# Patient Record
Sex: Female | Born: 1990 | Race: White | Hispanic: No | Marital: Married
Health system: Midwestern US, Academic
[De-identification: ages and names within clinical notes are randomized; demographics above are authoritative.]

## PROBLEM LIST (undated history)

## (undated) DIAGNOSIS — G43909 Migraine, unspecified, not intractable, without status migrainosus: Secondary | ICD-10-CM

## (undated) DIAGNOSIS — F419 Anxiety disorder, unspecified: Secondary | ICD-10-CM

## (undated) HISTORY — PX: HIP SURGERY: SHX245

## (undated) HISTORY — DX: Anxiety disorder, unspecified: F41.9

## (undated) HISTORY — DX: Migraine, unspecified, not intractable, without status migrainosus: G43.909

## (undated) LAB — ABO/RH: Rh Factor: POSITIVE

## (undated) LAB — ANTIBODY SCREEN: Antibody Screen: NEGATIVE

## (undated) LAB — HIV 1+2 ANTIBODY/ANTIGEN WITH REFLEX: HIV 1+2 AB/AGN: NEGATIVE

## (undated) LAB — HEMOGLOBINOPATHY EVALUATION: Hgb C: NONREACTIVE

## (undated) LAB — TREPONEMA PALLIDUM AB WITH REFLEX: Treponema Pallidum: NONREACTIVE

## (undated) LAB — RUBELLA IMMUNE STATUS: Rubella IgG Scr: IMMUNE

## (undated) LAB — STREP B DNA AMPLIFIED ASSAY: Strep B DNA Amp: NEGATIVE

## (undated) LAB — HEPATITIS B SURFACE ANTIGEN: Hep B Surface Ag: NEGATIVE

---

## 2014-12-13 ENCOUNTER — Ambulatory Visit (INDEPENDENT_AMBULATORY_CARE_PROVIDER_SITE_OTHER): Payer: 59 | Admitting: Medical

## 2014-12-13 ENCOUNTER — Encounter: Payer: Self-pay | Admitting: Medical

## 2014-12-13 VITALS — BP 126/82 | HR 80 | Ht 67.0 in | Wt 200.2 lb

## 2014-12-13 DIAGNOSIS — G4700 Insomnia, unspecified: Secondary | ICD-10-CM | POA: Diagnosis not present

## 2014-12-13 DIAGNOSIS — Z0000 Encounter for general adult medical examination without abnormal findings: Secondary | ICD-10-CM | POA: Diagnosis not present

## 2014-12-13 DIAGNOSIS — Z3041 Encounter for surveillance of contraceptive pills: Secondary | ICD-10-CM

## 2014-12-13 LAB — POCT URINE PREGNANCY: Preg Test, Ur: NEGATIVE

## 2014-12-13 LAB — POCT URINALYSIS DIPSTICK
Bilirubin, UA: NEGATIVE
Blood, UA: NEGATIVE
Glucose, UA: NEGATIVE
KETONES UA: NEGATIVE
Leukocytes, UA: NEGATIVE
Nitrite, UA: NEGATIVE
PROTEIN UA: NEGATIVE
Urobilinogen, UA: NEGATIVE
pH, UA: 6

## 2014-12-13 MED ORDER — NORGESTIM-ETH ESTRAD TRIPHASIC 0.18/0.215/0.25 MG-35 MCG PO TABS: 1.0000 | ORAL_TABLET | Freq: Every day | ORAL | Status: AC

## 2014-12-13 MED ORDER — PHENTERMINE HCL 37.5 MG PO TABS: 37.5000 mg | ORAL_TABLET | Freq: Every day | ORAL | Status: AC

## 2014-12-13 NOTE — Progress Notes (Signed)
Subjective:   HPI  Laura Hull is a 24 y.o. female who presents for a complete physical.  New patient today.  Lived in [place], moved to [place] with fiance for 2 years, and now here in [place] for husband's job with [employer].   Preventative care: Last labs: none prior  Prior vaccinations: TD or Tdap: unsure Influenza: yearly Had HPV series, had whole series of vaccines freshman year of college  Concerns: Does have hx/o anxiety, gets panic attacks at times.   Never been on medication for this.  Sometimes lies in bed worrying about stuff.    Exercises regularly, eats healthy, was college athlete, but having problems losing weight.  Has had problems with weight for about 1.5 years.  Has recently added Yoga.  Gyn hx/o - no prior pregnancy.   Was on depo provera prior for about 4-5 years, current on Tri Sprintec, doing ok on this.  Periods are not particular painful or heavy.  Only has one ever sexual partner, fiance, and they were both virgins.   No urinary or vaginal c/o.    Has hx/o migraines, but gets these rarely.   Has sleep issues.  Takes melatonin daily.   Has trouble getting to sleep, but once sleep is fine.  Lies there thinking about.   Reviewed their medical, surgical, family, social, medication, and allergy history and updated chart as appropriate.  Past Medical History  Diagnosis Date  . Migraine     rarely gets, but family hx/o atypical migraines  . Anxiety     diagnosed freshman in college; no prior medication, no current counseling as of 11/2014    Past Surgical History  Procedure Laterality Date  . Hip surgery  2013, 2014    anterior surgery for each, bilat due to hip flexor issues, labral repairs, bone shaving    History   Social History  . Marital Status: Single    Spouse Name: N/A  . Number of Children: N/A  . Years of Education: N/A   Occupational History  . Not on file.   Social History Main Topics  . Smoking status: Never Smoker   . Smokeless  tobacco: Not on file  . Alcohol Use: 1.2 oz/week    2 Glasses of wine, 0 Standard drinks or equivalent per week     Comment: wine   . Drug Use: No  . Sexual Activity: Not on file   Other Topics Concern  . Not on file   Social History Narrative   Fiance, getting married 01/13/15, has exercise science degree, consider PT, husband works for [employer].   Exercise - was college soccer player, but now walking, running, biking, weight.  Diet -lots of home cooking.       Family History  Problem Relation Age of Onset  . Hypertension Father   . Diabetes Father   . Cancer Neg Hx   . Heart disease Neg Hx   . Stroke Neg Hx      Current outpatient prescriptions:  .  Ascorbic Acid (VITAMIN C PO), Take by mouth., Disp: , Rfl:  .  Multiple Vitamins-Minerals (MULTIVITAMIN GUMMIES ADULT PO), Take by mouth., Disp: , Rfl:  .  Norgestimate-Ethinyl Estradiol Triphasic (TRI-SPRINTEC) 0.18/0.215/0.25 MG-35 MCG tablet, Take 1 tablet by mouth daily., Disp: 1 Package, Rfl: 11 .  Omega-3 Fatty Acids (FISH OIL PO), Take by mouth., Disp: , Rfl:  .  phentermine (ADIPEX-P) 37.5 MG tablet, Take 1 tablet (37.5 mg total) by mouth daily before breakfast., Disp: 30 tablet,  Rfl: 1  No Known Allergies   Review of Systems Constitutional: -fever, -chills, -sweats, -unexpected weight change, -decreased appetite, -fatigue Allergy: -sneezing, -itching, -congestion Dermatology: -changing moles, --rash, -lumps ENT: -runny nose, -ear pain, -sore throat, -hoarseness, -sinus pain, -teeth pain, - ringing in ears, -hearing loss, -nosebleeds Cardiology: -chest pain, -palpitations, -swelling, -difficulty breathing when lying flat, -waking up short of breath Respiratory: -cough, -shortness of breath, -difficulty breathing with exercise or exertion, -wheezing, -coughing up blood Gastroenterology: -abdominal pain, -nausea, -vomiting, -diarrhea, -constipation, -blood in stool, -changes in bowel movement, -difficulty swallowing or  eating Hematology: -bleeding, -bruising  Musculoskeletal: -joint aches, -muscle aches, -joint swelling, -back pain, -neck pain, -cramping, -changes in gait Ophthalmology: denies vision changes, eye redness, itching, discharge Urology: -burning with urination, -difficulty urinating, -blood in urine, -urinary frequency, -urgency, -incontinence Neurology: -headache, -weakness, -tingling, -numbness, -memory loss, -falls, -dizziness Psychology: -depressed mood, -agitation, -sleep problems     Objective:   Physical Exam  BP 126/82 mmHg  Pulse 80  Ht  (1.702 m)  Wt 200 lb 3.2 oz (90.81 kg)  BMI 31.35 kg/m2  General appearance: alert, no distress, WD/WN, obese white female Skin: no worrisome lesions HEENT: normocephalic, conjunctiva/corneas normal, sclerae anicteric, PERRLA, EOMi, nares patent, scar tissue right TM, slight scar tissue left lower TM, no discharge or erythema, pharynx normal Oral cavity: MMM, tongue normal, teeth normal Neck: supple, no lymphadenopathy, no thyromegaly, no masses, normal ROM, no bruits Chest: non tender, normal shape and expansion Heart: RRR, normal S1, S2, no murmurs Lungs: CTA bilaterally, no wheezes, rhonchi, or rales Abdomen: +bs, soft, non tender, non distended, no masses, no hepatomegaly, no splenomegaly, no bruits Back: non tender, normal ROM, no scoliosis Musculoskeletal: +bilat anterior hip arthroscopic port surgical scars, upper extremities non tender, no obvious deformity, normal ROM throughout, lower extremities non tender, no obvious deformity, normal ROM throughout Extremities: no edema, no cyanosis, no clubbing Pulses: 2+ symmetric, upper and lower extremities, normal cap refill Neurological: alert, oriented x 3, CN2-12 intact, strength normal upper extremities and lower extremities, sensation normal throughout, DTRs 2+ throughout, no cerebellar signs, gait normal Psychiatric: normal affect, behavior normal, pleasant  Breast: not  examined Gyn: Normal external genitalia without lesions, vagina with normal mucosa, cervix without lesions, no cervical motion tenderness, no abnormal vaginal discharge.  Uterus and adnexa not enlarged, nontender, no masses.  Exam chaperoned by nurse. Rectal: deferred  Assessment and Plan :    Encounter Diagnoses  Name Primary?  . Encounter for health maintenance examination in adult Yes  . Encounter for surveillance of contraceptive pills   . Insomnia    Physical exam - discussed healthy lifestyle, diet, exercise, preventative care, vaccinations, and addressed their concerns.  Handout given.  Contraception - We discussed her interest in oral hormonal contraception.  Discussed treatment options, types of OCPs, various other methods available.  Discussed non hormonal contraception as well.  We discussed risks of OCPs including headache, nausea, breast tenderness, irregular bleeding or spotting, possible risks of blood clots, heart attack, stroke.     Discussed advantages of OCPs including possible decreased premenstrual symptoms, improving cramps, regulating cycles, decreasing heavy flow.  Discussed proper use of medication.  She understands the benefits, risks, and wishes to c/t TriSprintec. Urine pregnancy negative. Congratulated her on her upcoming wedding.     Insomnia - discussed her concerns, ways to deal with thoughts, anxiety, sleep strategies.   Can c/t melatonin for now  Obesity - discussed diet, exercise, strategies to lose weight, sleep.   Begin  trial of phentermine.   Discussed risks/benefits of medication.  F/u 4-6 wk, sooner prn.  Follow-up 4-6 wk on medication

## 2015-01-29 ENCOUNTER — Ambulatory Visit: Payer: 59 | Admitting: Medical

## 2015-02-19 ENCOUNTER — Emergency Department (HOSPITAL_COMMUNITY): Payer: Worker's Compensation

## 2015-02-19 ENCOUNTER — Emergency Department (HOSPITAL_COMMUNITY)
Admission: EM | Admit: 2015-02-19 | Discharge: 2015-02-19 | Disposition: A | Discharge status: Home / Self Care | Payer: Worker's Compensation | Attending: Emergency Medicine | Admitting: Emergency Medicine

## 2015-02-19 ENCOUNTER — Encounter (HOSPITAL_COMMUNITY): Payer: Self-pay | Admitting: Emergency Medicine

## 2015-02-19 DIAGNOSIS — Y998 Other external cause status: Secondary | ICD-10-CM | POA: Insufficient documentation

## 2015-02-19 DIAGNOSIS — W540XXA Bitten by dog, initial encounter: Secondary | ICD-10-CM | POA: Diagnosis not present

## 2015-02-19 DIAGNOSIS — Z23 Encounter for immunization: Secondary | ICD-10-CM | POA: Insufficient documentation

## 2015-02-19 DIAGNOSIS — S61412A Laceration without foreign body of left hand, initial encounter: Secondary | ICD-10-CM | POA: Insufficient documentation

## 2015-02-19 DIAGNOSIS — Y9389 Activity, other specified: Secondary | ICD-10-CM | POA: Diagnosis not present

## 2015-02-19 DIAGNOSIS — Z8679 Personal history of other diseases of the circulatory system: Secondary | ICD-10-CM | POA: Insufficient documentation

## 2015-02-19 DIAGNOSIS — Z8659 Personal history of other mental and behavioral disorders: Secondary | ICD-10-CM | POA: Diagnosis not present

## 2015-02-19 DIAGNOSIS — Y9289 Other specified places as the place of occurrence of the external cause: Secondary | ICD-10-CM | POA: Diagnosis not present

## 2015-02-19 MED ORDER — LIDOCAINE-EPINEPHRINE (PF) 2 %-1:200000 IJ SOLN
20.0000 mL | Freq: Once | INTRAMUSCULAR | Status: DC
  Filled 2015-02-19: qty 20

## 2015-02-19 MED ORDER — HYDROCODONE-ACETAMINOPHEN 5-325 MG PO TABS: 1.0000 | ORAL_TABLET | Freq: Four times a day (QID) | ORAL | Status: AC | PRN

## 2015-02-19 MED ORDER — BACITRACIN ZINC 500 UNIT/GM EX OINT
1.0000 "application " | TOPICAL_OINTMENT | Freq: Two times a day (BID) | CUTANEOUS | Status: DC
  Administered 2015-02-19: 1 via TOPICAL

## 2015-02-19 MED ORDER — OXYCODONE-ACETAMINOPHEN 5-325 MG PO TABS
1.0000 | ORAL_TABLET | Freq: Once | ORAL | Status: AC
  Administered 2015-02-19: 1 via ORAL
  Filled 2015-02-19: qty 1

## 2015-02-19 MED ORDER — AMOXICILLIN-POT CLAVULANATE 875-125 MG PO TABS: 1.0000 | ORAL_TABLET | Freq: Two times a day (BID) | ORAL | Status: AC

## 2015-02-19 MED ORDER — SODIUM BICARBONATE 4 % IV SOLN
5.0000 mL | Freq: Once | INTRAVENOUS | Status: DC
  Filled 2015-02-19: qty 5

## 2015-02-19 MED ORDER — TETANUS-DIPHTH-ACELL PERTUSSIS 5-2.5-18.5 LF-MCG/0.5 IM SUSP
0.5000 mL | Freq: Once | INTRAMUSCULAR | Status: AC
  Administered 2015-02-19: 0.5 mL via INTRAMUSCULAR
  Filled 2015-02-19: qty 0.5

## 2015-02-19 NOTE — Progress Notes (Signed)
Orthopedic Tech Progress Note Patient Details:  Laura Hull March 23, 1991 [id number]  Ortho Devices Type of Ortho Device: Ace wrap, Volar splint Ortho Device/Splint Interventions: Application   Cammer, Mickie Bail 02/19/2015, 6:22 PM

## 2015-02-19 NOTE — ED Notes (Signed)
MD Rees at the bedside  

## 2015-02-19 NOTE — ED Notes (Signed)
Patient reports, "I feel like I am going to pass out." Patient's feet placed up and head laid back. Patient given cool rag to place on her forehead. Waiting for pain relief. Patient encouraged to take deep breaths and relax.

## 2015-02-19 NOTE — ED Notes (Signed)
Pt from [facility] for eval of dog bite to left hand. States dog was up to date on all shots. Sensation intact and t able to move fingers, pulses present at this time. Puncture wound noted to anterior part of hand and puncture wound to palm. nad noted. Pt tearful.

## 2015-02-19 NOTE — ED Notes (Signed)
Called Phlebotomy to complete.

## 2015-02-19 NOTE — ED Notes (Signed)
PA made aware of the patient's current condition.

## 2015-02-19 NOTE — ED Provider Notes (Signed)
CSN: [id number]     Arrival date & time 02/19/15  1118 History  This chart was scribed for non-physician practitioner, Roxy Horseman, PA-C working with Tilden Fossa, MD by Doreatha Martin, ED scribe. This patient was seen in room TR05C/TR05C and the patient's care was started at 11:43 AM    Chief Complaint  Patient presents with  . Animal Bite   The history is provided by the patient. No language interpreter was used.    HPI Comments: Laura Hull is a 24 y.o. female who presents to the Emergency Department with a chief complaint of an dog bite with controlled bleeding on the left hand to the palmar and anterior aspect proximal to the thumb that occurred this morning. She states associated moderate pain, lightheadedness secondary to pain. Pt notes that pain is worsened with movement, resistance. She notes that she is able to move her digits. 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 Her tetanus is out of date. She denies any other injuries.   Past Medical History  Diagnosis Date  . Migraine     rarely gets, but family hx/o atypical migraines  . Anxiety     diagnosed freshman in college; no prior medication, no current counseling as of 11/2014   Past Surgical History  Procedure Laterality Date  . Hip surgery  2013, 2014    anterior surgery for each, bilat due to hip flexor issues, labral repairs, bone shaving   Family History  Problem Relation Age of Onset  . Hypertension Father   . Diabetes Father   . Cancer Neg Hx   . Heart disease Neg Hx   . Stroke Neg Hx    Social History  Substance Use Topics  . Smoking status: Never Smoker   . Smokeless tobacco: None  . Alcohol Use: 1.2 oz/week    2 Glasses of wine, 0 Standard drinks or equivalent per week     Comment: wine    OB History    No data available     Review of Systems  Musculoskeletal: Positive for arthralgias.  Skin: Positive for wound.  Neurological: Positive for light-headedness.    Allergies  Review of patient's allergies indicates no known allergies.  Home Medications   Prior to Admission medications   Medication Sig Start Date End Date Taking? Authorizing Provider  Ascorbic Acid (VITAMIN C PO) Take by mouth.    Historical Provider, MD  Multiple Vitamins-Minerals (MULTIVITAMIN GUMMIES ADULT PO) Take by mouth.    Historical Provider, MD  Norgestimate-Ethinyl Estradiol Triphasic (TRI-SPRINTEC) 0.18/0.215/0.25 MG-35 MCG tablet Take 1 tablet by mouth daily. 12/13/14   Kermit Balo Tysinger, PA-C  Omega-3 Fatty Acids (FISH OIL PO) Take by mouth.    Historical Provider, MD  phentermine (ADIPEX-P) 37.5 MG tablet Take 1 tablet (37.5 mg total) by mouth daily before breakfast. 12/13/14   Kermit Balo Tysinger, PA-C   BP 128/99 mmHg  Pulse 99  Temp(Src) 98.4 F (36.9 C) (Oral)  Resp 18  Ht 5\' 7"  (1.702 m)  Wt 190 lb (86.183 kg)  BMI 29.75 kg/m2  SpO2 100%  LMP 02/19/2015 (Exact Date) Physical Exam  Constitutional: She is oriented to person, place, and time. She appears well-developed and well-nourished.  HENT:  Head: Normocephalic and atraumatic.  Eyes: Conjunctivae and EOM are normal. Pupils are equal, round, and reactive to light.  Neck: Normal range of motion. Neck supple.  Cardiovascular: Normal rate and intact distal pulses.   Intact distal pulses  with brisk capillary refill  Pulmonary/Chest: Effort normal. No respiratory distress.  Abdominal: She exhibits no distension.  Musculoskeletal: Normal range of motion.  Range of motion and strength of left digits is 5/5 isolated at all joints  Neurological: She is alert and oriented to person, place, and time.  Distal sensation intact  Skin: Skin is warm and dry.  Left hand 1 cm laceration on posterior aspect, 1 cm laceration on Palmer aspect, no retained foreign body  Psychiatric: She has a normal mood and affect. Her behavior is normal.  Nursing note and vitals reviewed.  ED Course  Procedures (including critical care  time) DIAGNOSTIC STUDIES: Oxygen Saturation is 100% on RA, normal by my interpretation.    COORDINATION OF CARE: 11:48 AM Discussed treatment plan with pt at bedside and pt agreed to plan.   Imaging Review Dg Hand Complete Left  02/19/2015   CLINICAL DATA:  Laceration, puncture wound to the anterior and posterior left hand.  EXAM: LEFT HAND - COMPLETE 3+ VIEW  COMPARISON:  None.  FINDINGS: There is no evidence of fracture or dislocation. There is no evidence of arthropathy or other focal bone abnormality. Soft tissues are unremarkable.  IMPRESSION: No acute osseous injury of the left hand.   Electronically Signed   By: Elige Ko   On: 02/19/2015 13:07   I have personally reviewed and evaluated these images and lab results as part of my medical decision-making.  SPLINT APPLICATION Date/Time: 2:54 PM Authorized by: Roxy Horseman Consent: Verbal consent obtained. Risks and benefits: risks, benefits and alternatives were discussed Consent given by: patient Splint applied XB:[id number] technician Location details: left hand/wrist Splint type: volar Supplies used: fiberglass/ace Post-procedure: The splinted body part was neurovascularly unchanged following the procedure. Patient tolerance: Patient tolerated the procedure well with no immediate complications.    MDM   Final diagnoses:  Dog bite    Patient with 2 small lacerations on her left hand from a dog bite. One is posterior, one is [employer]. There is no evidence of tendon injury. Plain films are negative. Discussed patient with Dr. Madilyn Hook, who recommends consultation with hand surgery for close follow-up. Appreciate phone consultation with Dr. Orlan Leavens, who recommends not closing the lacerations, placing the patient in a splint to immobilize the fingers and starting the patient on anti-biotics. I will give the patient Augmentin. She will need to follow-up with Dr. Melvyn Novas in his office. Return precautions have been given. The wound  was copiously irrigated and rinsed in the emergency department.   I personally performed the services described in this documentation, which was scribed in my presence. The recorded information has been reviewed and is accurate.      Roxy Horseman, PA-C 02/19/15 1455  Tilden Fossa, MD 02/19/15 [phone]

## 2015-02-19 NOTE — Discharge Instructions (Signed)

## 2015-02-19 NOTE — ED Notes (Signed)
Patient waiting for Phlebotomy for Worker's Comp case.

## 2015-02-19 NOTE — ED Notes (Signed)
Patient returned from X-ray 

## 2016-04-25 ENCOUNTER — Inpatient Hospital Stay
Admit: 2016-04-25 | Discharge: 2016-04-25 | Disposition: A | Discharge status: Home / Self Care | Payer: PRIVATE HEALTH INSURANCE

## 2016-04-25 DIAGNOSIS — O99611 Diseases of the digestive system complicating pregnancy, first trimester: Secondary | ICD-10-CM

## 2016-04-25 LAB — DIFFERENTIAL
Basophils Absolute: 9 /uL (ref 0–200)
Basophils Relative: 0.1 % (ref 0.0–1.0)
Eosinophils Absolute: 71 /uL (ref 15–500)
Eosinophils Relative: 0.8 % (ref 0.0–8.0)
Lymphocytes Absolute: 2287 /uL (ref 850–3900)
Lymphocytes Relative: 25.7 % (ref 15.0–45.0)
Monocytes Absolute: 703 /uL (ref 200–950)
Monocytes Relative: 7.9 % (ref 0.0–12.0)
Neutrophils Absolute: 5830 /uL (ref 1500–7800)
Neutrophils Relative: 65.5 % (ref 40.0–80.0)

## 2016-04-25 LAB — URINALYSIS W/RFL TO MICROSCOPIC
Bilirubin, UA: NEGATIVE
Glucose, UA: NEGATIVE mg/dL
Ketones, UA: NEGATIVE mg/dL
Leukocyte Esterase, UA: NEGATIVE
Nitrite, UA: NEGATIVE
Protein, UA: NEGATIVE mg/dL
RBC, UA: <1 /HPF (ref 0–3)
Specific Gravity, UA: 1.013 (ref 1.005–1.035)
Squam Epithel, UA: <1 /HPF (ref 0–5)
Urobilinogen, UA: <2 mg/dL (ref 0.2–1.9)
WBC, UA: <1 /HPF (ref 0–5)
pH, UA: 6 (ref 5.0–8.0)

## 2016-04-25 LAB — BASIC METABOLIC PANEL
Anion Gap: 7 mmol/L (ref 3–16)
BUN: 10 mg/dL (ref 7–25)
CO2: 26 mmol/L (ref 21–33)
Calcium: 9.3 mg/dL (ref 8.6–10.3)
Chloride: 103 mmol/L (ref 98–110)
Creatinine: 0.69 mg/dL (ref 0.60–1.30)
Glucose: 96 mg/dL (ref 70–100)
Osmolality, Calculated: 281 mosm/kg (ref 278–305)
Potassium: 3.7 mmol/L (ref 3.5–5.3)
Sodium: 136 mmol/L (ref 133–146)
eGFR AA CKD-EPI: >90 See note.
eGFR NONAA CKD-EPI: >90 See note.

## 2016-04-25 LAB — CBC
Hematocrit: 39.7 % (ref 35.0–45.0)
Hemoglobin: 13.6 g/dL (ref 11.7–15.5)
MCH: 29.2 pg (ref 27.0–33.0)
MCHC: 34.3 g/dL (ref 32.0–36.0)
MCV: 85 fL (ref 80.0–100.0)
MPV: 8 fL (ref 7.5–11.5)
Platelets: 221 10E3/uL (ref 140–400)
RBC: 4.67 10E6/uL (ref 3.80–5.10)
RDW: 12.4 % (ref 11.0–15.0)
WBC: 8.9 10E3/uL (ref 3.8–10.8)

## 2016-04-25 LAB — HCG, QUANTITATIVE, PREGNANCY: hCG Quant: 17057 m[IU]/mL

## 2016-04-25 LAB — HCG URINE, QUALITATIVE: Preg Test, Ur: POSITIVE — AB

## 2016-04-25 NOTE — ED Provider Notes (Signed)
[place] ED Note    Date of service:  04/25/2016    Reason for Visit: Constipation      Patient History     HPI  Patient is a 25 year old female who presents the emergency department complaining of constipation.  Patient states she believes she is approximately [redacted] weeks pregnant based on last menstrual period.  She has not seen her OB/GYN.  She states she normally has bowel movements every other day in the past if she gets constipated will occasionally take MiraLAX.  She states she has not had a bowel movement for 4 days.  2 days ago she contacted her OB/GYN who recommended that she take MiraLAX and she states this is not helped her constipation.  She states she has been pressing on her abdomen because this makes it feel better.  Tonight she had cramping pain in her abdomen that woke her from sleep and she states she was diaphoretic.  The pain has at this point improved.  She states the pain waxes and wanes.  She states she mostly feels that his lower abdomen.  She feels that the pain improves when she presses on her stomach.  Admits to nausea.  Denies vomiting, vaginal bleeding or discharge.     No past medical history on file.    No past surgical history on file.    Patient  has no tobacco, alcohol, and drug history on file.      Previous Medications    POLYETHYLENE GLYCOL (MIRALAX) 17 GRAM PACKET    Take 17 g by mouth daily.    PRENAT 115-IRON FUM-FOLIC-DSS ORAL    Take by mouth.       Allergies:   Allergies as of 04/25/2016    (No Known Allergies)       Review of Systems     Review of Systems   Constitutional: Negative for chills and fever.   Respiratory: Negative for shortness of breath.    Cardiovascular: Negative for chest pain.   Gastrointestinal: Positive for abdominal pain, constipation and nausea. Negative for diarrhea and vomiting.   Genitourinary: Negative for difficulty urinating, dysuria, pelvic pain, vaginal bleeding and vaginal discharge.    Musculoskeletal: Negative for back pain.   Skin: Negative for color change and wound.   Neurological: Negative for headaches.   All other systems reviewed and are negative.          Physical Exam     ED Triage Vitals [04/25/16 0214]   Vital Signs Group      Temp 97.8 F (36.6 C)      Temp src       Heart Rate 79      Heart Rate Source       Resp 16      SpO2 100 %      BP 137/74      BP Location Left arm      BP Method Automatic      Patient Position Sitting   SpO2 100 %   O2 Device        Physical Exam   Nursing note and vitals reviewed.  Constitutional: She appears well-developed and well-nourished. No distress.   HENT:   Head: Normocephalic and atraumatic.   Cardiovascular: Normal rate, regular rhythm and normal heart sounds.    Pulmonary/Chest: Effort normal and breath sounds normal. No respiratory distress.   Abdominal: Soft. Bowel sounds are normal. She exhibits no distension. There is no tenderness.   Reports soreness in her  abdomen from where she was pressing on it but no tenderness on exam   Musculoskeletal: Normal range of motion.   Neurological: She is oriented to person, place, time and situation.  Normal speech without aphasia or dysarthria.  Moves all extremities spontaneosly and symmetrically.    Skin: Skin is warm and dry. She is not diaphoretic.   Psychiatric: She has a normal mood and affect. Her behavior is normal. Judgment and thought content normal.         Diagnostic Studies     Labs:    Labs Reviewed   CBC   DIFFERENTIAL   BASIC METABOLIC PANEL   HCG, QUANTITATIVE, PREGNANCY   URINALYSIS W/RFL TO MICROSCOP, NO CULT   HCG URINE, QUALITATIVE         Radiology:    No tests were performed during this ED visit    EKG:    No EKG Performed    Emergency Department Procedures     Procedures    ED Course and MDM     Dilpreet Lovins is a 25 y.o. female who presented to the emergency department with Constipation      ED Course     History and physical exam are performed.  Patient is well-appearing and  in no acute distress.  She has no focal area of tenderness.  Patient reports that she believes she is approximately [redacted] weeks pregnant based on last menstrual period.  She has not followed up with an OB/GYN at this point.  She has had constipation in the past prior to pregnancy and states that she has to taking MiraLAX 2 days ago and still has been woken up the last 2 nights with cramping in her abdomen.    We did obtain some baseline labs as well as a serum Quant to ensure that this is not related to her pregnancy complication.  Lives are in process.  Plan is for Dr. Ellis Savage to follow up on labs and possibly perform a transvaginal ultrasound.  Patient denies any vaginal bleeding or discharge.  If labs are unremarkable patient can likely be discharged with instructions for constipation management including continuing MiraLAX and possibly adding milk of magnesia.  She'll follow-up with her OB/GYN.    This patient has been seen and evaluated by my attending physician, Dartha Lodge, MD,  who agrees with my treatment and assessment and plan       Critical Care Time (Attendings)          Wardell [place], [place]  04/25/16 [phone]

## 2016-04-25 NOTE — Unmapped (Addendum)
Discharge instructions    ?? Increasing dietary fiber and fluids or using bulk-forming laxatives (Miralax) is the preferred treatment of constipation during pregnancy since these agents are not absorbed    ?? Take all your medications as directed.   - May use milk of magnesia if needed     ?? Follow up as instructed with your OBGYN.     ?? Return to the Emergency Department for any new or worsening symptoms

## 2016-04-25 NOTE — ED Notes (Signed)
MD at bedside.

## 2016-04-25 NOTE — ED Triage Notes (Signed)
Patient complaining of constipation since Tuesday with abd pain.  States she is [redacted] weeks pregnant.

## 2016-04-25 NOTE — ED Provider Notes (Signed)
ED Attending Attestation Note    Date of service:  04/25/2016    This patient was seen by the advanced practice provider.  I have seen and examined the patient, agree with the workup, evaluation, management and diagnosis.  The care plan has been discussed and I concur.      My assessment reveals a 25 y.o. female with abdominal cramping, not had a BM in a week.  Exam is unremarkable.  Labs unremarkable.  Will recommend increasing miralax and close f/u with OB/Gyn.

## 2016-06-14 IMAGING — DX DG HAND COMPLETE 3+V*L*
3 series · 3 of 3 positions shown · non-contrast
Comparison: None.

CLINICAL DATA: Laceration, puncture wound to the anterior and
posterior left hand.

EXAM:
LEFT HAND - COMPLETE 3+ VIEW

[hand ap]
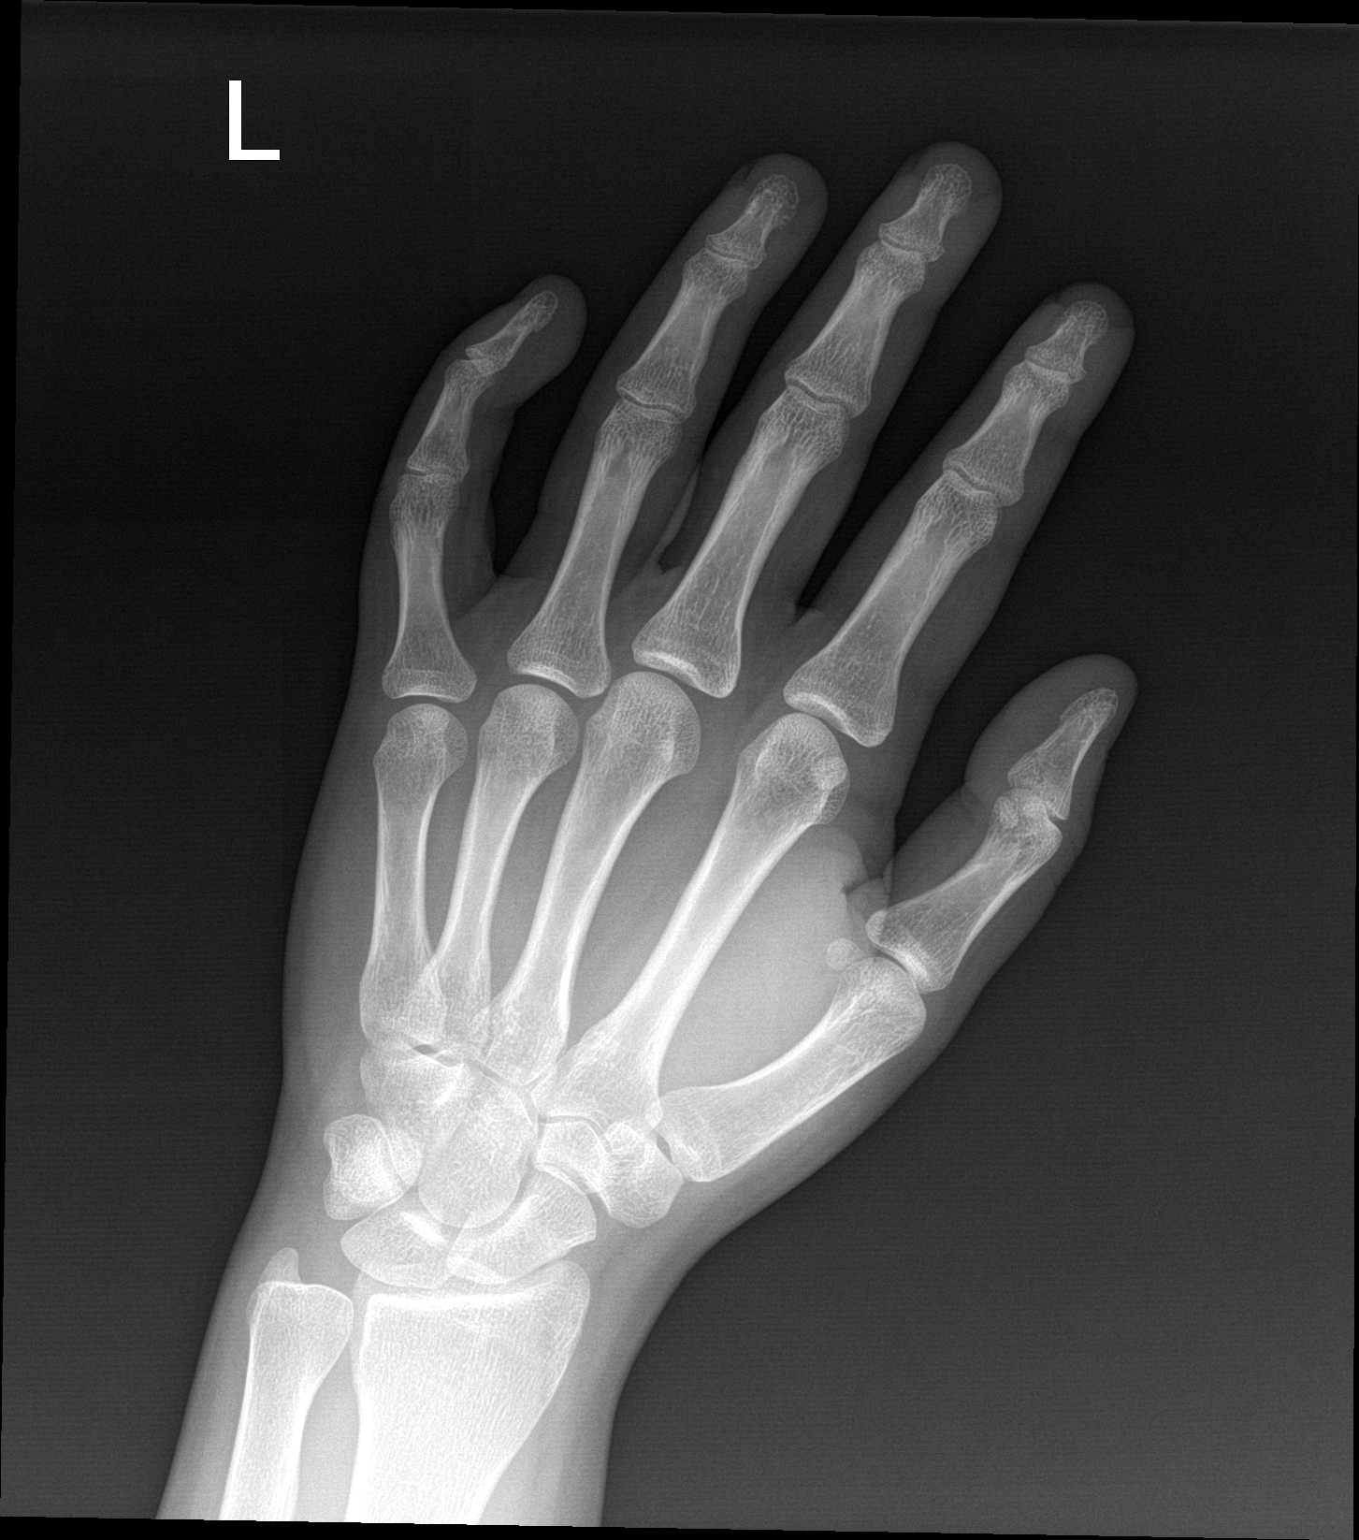

[hand obl]
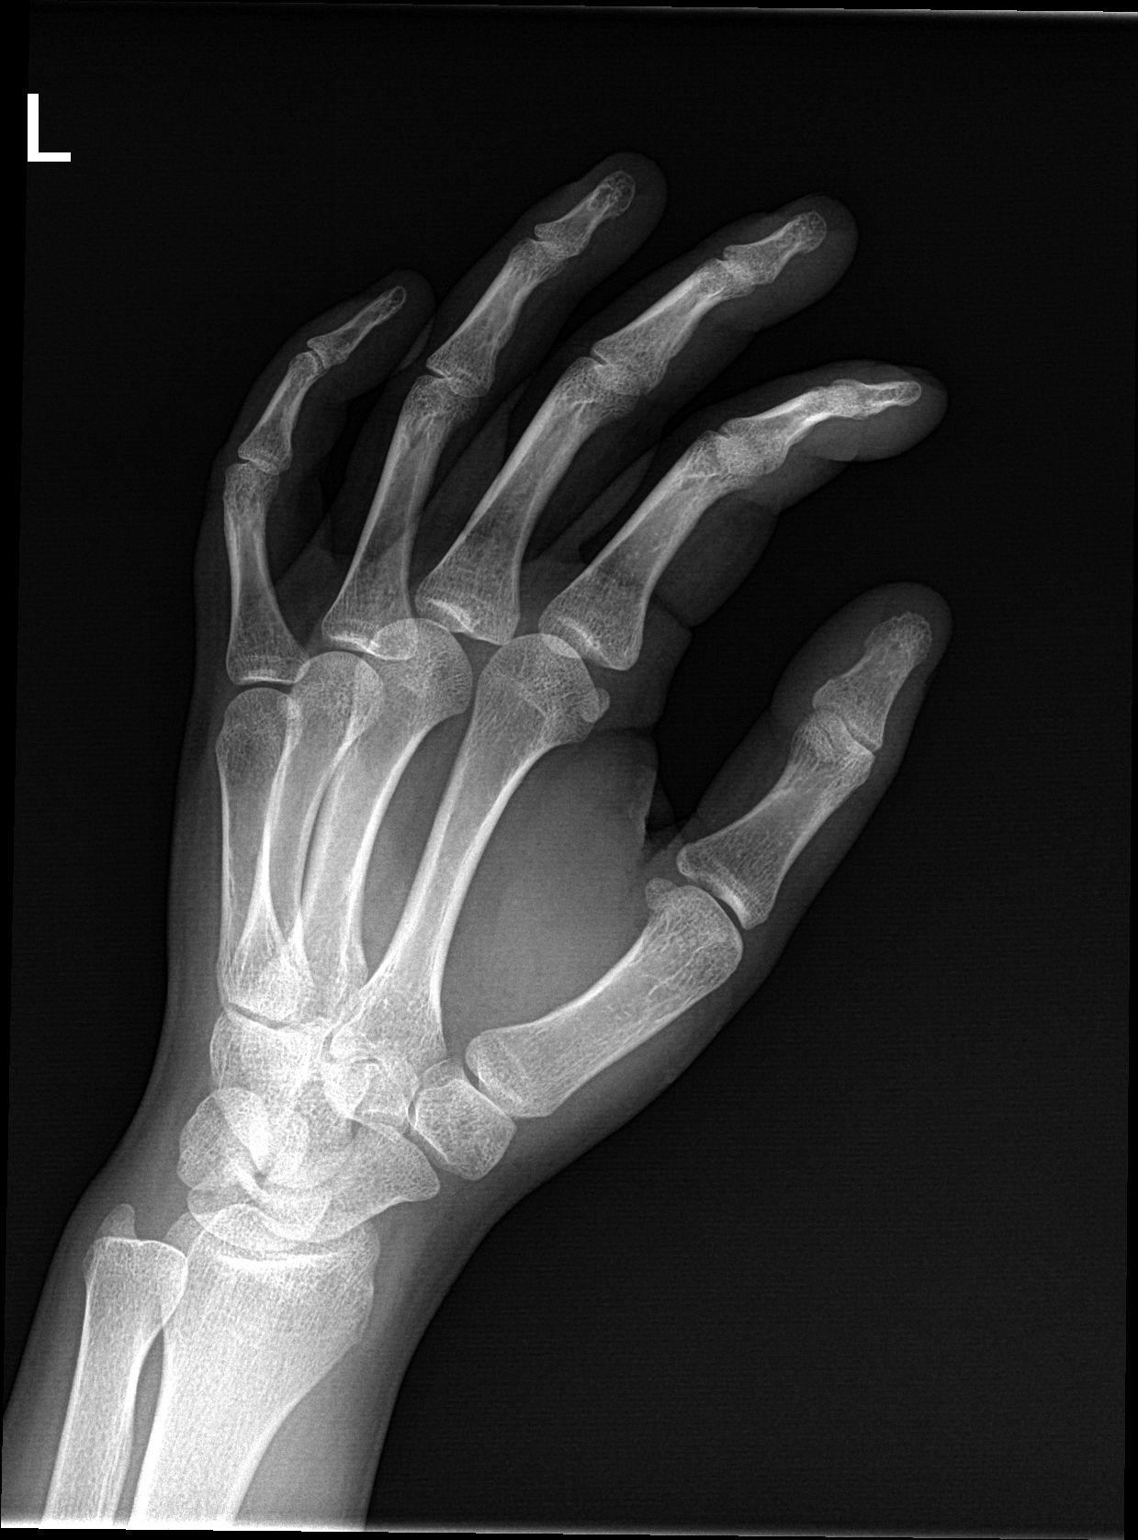

[hand lat]
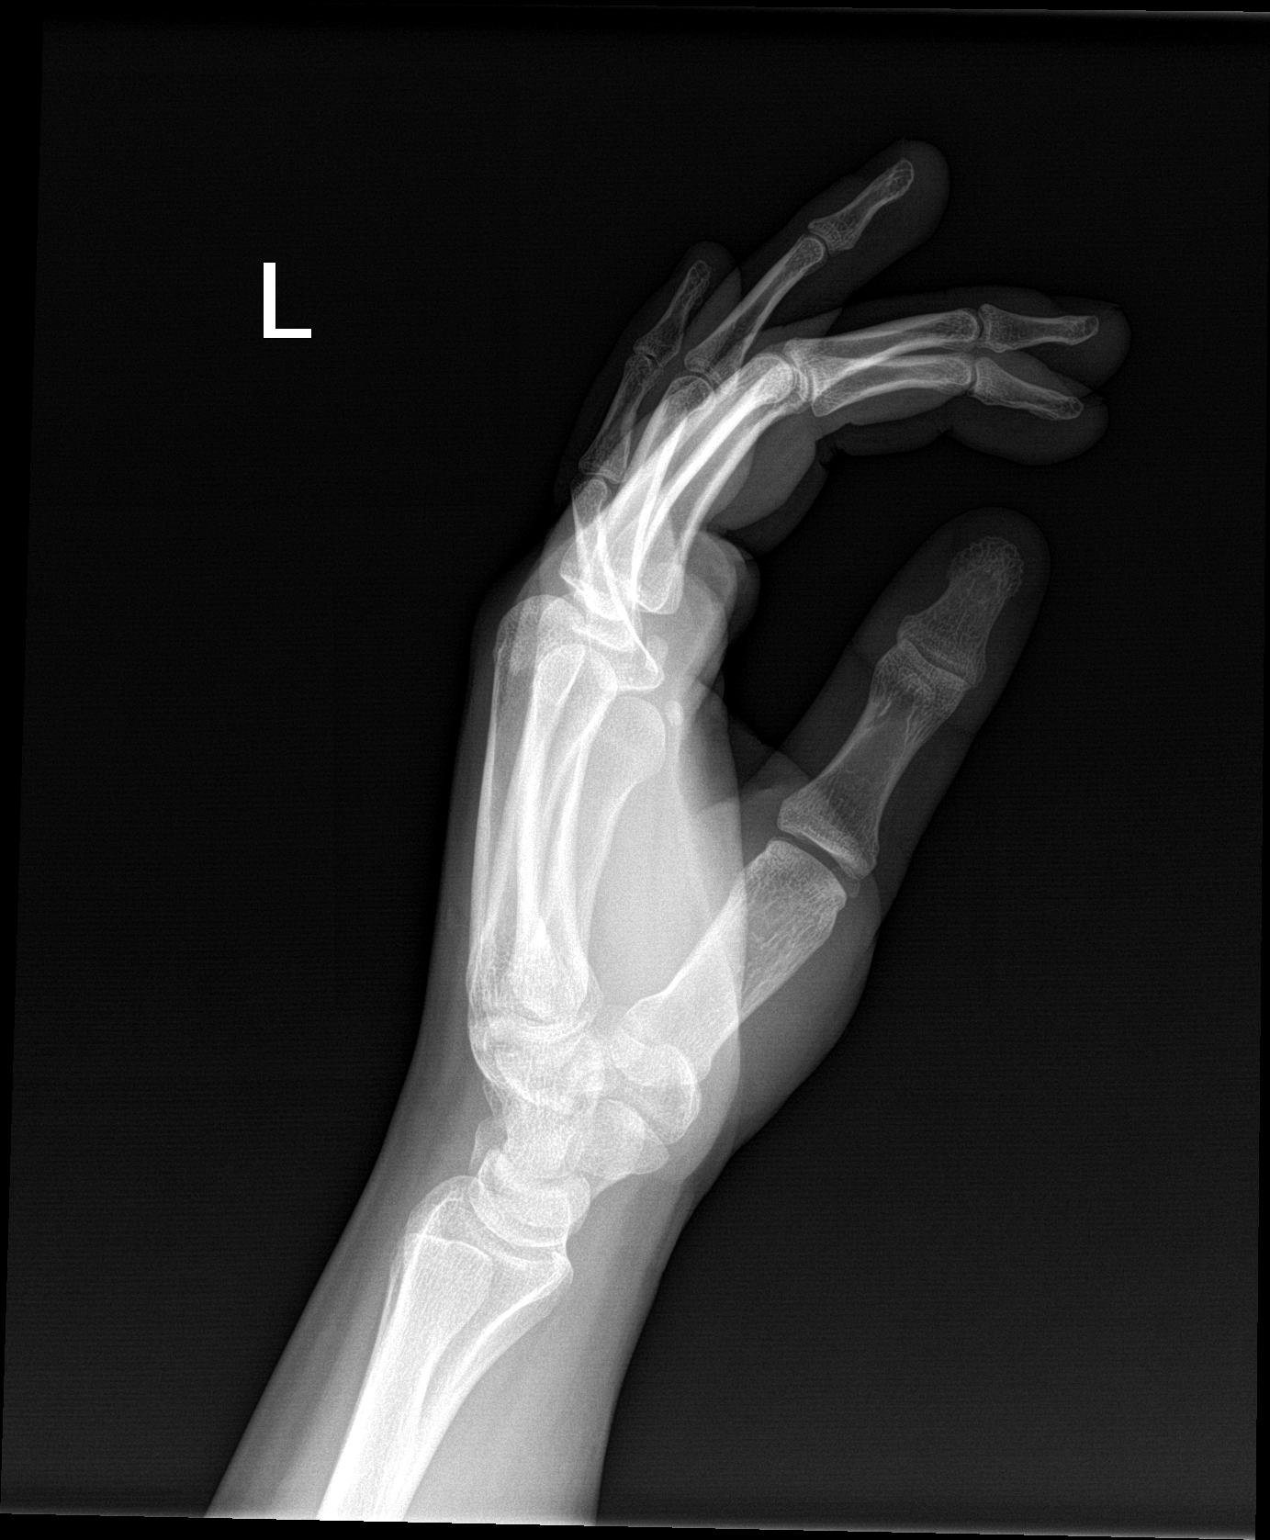

[3 of 3 positions shown; findings below may reference images not displayed]

FINDINGS: There is no evidence of fracture or dislocation. There is no
evidence of arthropathy or other focal bone abnormality. Soft
tissues are unremarkable.
IMPRESSION: No acute osseous injury of the left hand.

## 2016-06-26 ENCOUNTER — Inpatient Hospital Stay: Attending: Obstetrics & Gynecology | Primary: Obstetrics & Gynecology

## 2016-07-24 ENCOUNTER — Encounter: Primary: Obstetrics & Gynecology

## 2016-07-31 ENCOUNTER — Encounter: Primary: Obstetrics & Gynecology

## 2016-08-28 LAB — CBC
Hematocrit: 39.1 % (ref 36.0–48.0)
Hemoglobin: 13.2 g/dL (ref 12.0–16.0)
MCH: 30.1 pg (ref 26.0–34.0)
MCHC: 33.8 g/dL (ref 31.0–36.0)
MCV: 88.9 fL (ref 80.0–100.0)
MPV: 8.6 fL (ref 5.0–10.5)
Platelets: 229 10*3/uL (ref 135–450)
RBC: 4.4 M/uL (ref 4.00–5.20)
RDW: 13.1 % (ref 12.4–15.4)
WBC: 12 10*3/uL — ABNORMAL HIGH (ref 4.0–11.0)

## 2016-08-28 LAB — GLUCOSE CHALLENGE GESTATIONAL: Gluc Chal: 88 mg/dL (ref <130)

## 2016-12-10 ENCOUNTER — Ambulatory Visit: Admit: 2016-12-10 | Payer: PRIVATE HEALTH INSURANCE

## 2016-12-10 DIAGNOSIS — O163 Unspecified maternal hypertension, third trimester: Secondary | ICD-10-CM

## 2016-12-10 DIAGNOSIS — O9989 Other specified diseases and conditions complicating pregnancy, childbirth and the puerperium: Secondary | ICD-10-CM

## 2016-12-10 LAB — CBC
Hematocrit: 35.5 % (ref 35.0–45.0)
Hemoglobin: 12.6 g/dL (ref 11.7–15.5)
MCH: 30.5 pg (ref 27.0–33.0)
MCHC: 35.5 g/dL (ref 32.0–36.0)
MCV: 85.9 fL (ref 80.0–100.0)
MPV: 8.5 fL (ref 7.5–11.5)
Platelets: 200 10*3/uL (ref 140–400)
RBC: 4.14 10*6/uL (ref 3.80–5.10)
RDW: 12.7 % (ref 11.0–15.0)
WBC: 10.6 10*3/uL (ref 3.8–10.8)

## 2016-12-10 LAB — COMPREHENSIVE METABOLIC PANEL, SERUM
ALT: 10 U/L (ref 7–52)
AST (SGOT): 13 U/L (ref 13–39)
Albumin: 3.3 g/dL (ref 3.5–5.7)
Alkaline Phosphatase: 130 U/L (ref 36–125)
Anion Gap: 10 mmol/L (ref 3–16)
BUN: 12 mg/dL (ref 7–25)
CO2: 24 mmol/L (ref 21–33)
Calcium: 9.4 mg/dL (ref 8.6–10.3)
Chloride: 104 mmol/L (ref 98–110)
Creatinine: 0.67 mg/dL (ref 0.60–1.30)
Glucose: 89 mg/dL (ref 70–100)
Osmolality, Calculated: 285 mOsm/kg (ref 278–305)
Potassium: 4.2 mmol/L (ref 3.5–5.3)
Sodium: 138 mmol/L (ref 133–146)
Total Bilirubin: 0.3 mg/dL (ref 0.0–1.5)
Total Protein: 6.1 g/dL (ref 6.4–8.9)
eGFR AA CKD-EPI: >90 See note.
eGFR NONAA CKD-EPI: >90 See note.

## 2016-12-10 LAB — URIC ACID: Uric Acid: 6.1 mg/dL (ref 3.8–8.7)

## 2016-12-10 NOTE — Progress Notes (Signed)
Called Dr Sheppard Penton, update given:  Patient came into triage for c/o feet swelling and turning purple.  Upon assessment feet are tan, pink, warm & dry, pedal pulses present and +2, non pitting edema, denies ruq epigastric pain, reports having headache and seeing spots, not contracting but does report lower back cramping, nst reactive, category 1, baseline 140 with accelerations and no decelerations.  Telephone orders rec'd to discharge patient home and tell her to rest.  Plan discussed with patient, monitors off and up to change clothes for discharge home.

## 2016-12-10 NOTE — Unmapped (Signed)
Triage/Antepartum/Home Undelivered Discharge Instructions    After Discharge Orders:    No future appointments.    Call physician office at [phone] for any questions or concerns.  You can take extra strength Tylenol as directed for headaches, but if headaches are unrelieved by Tylenol please call your ob doctor office.      Current Discharge Medication List      CONTINUE these medications which have NOT CHANGED    Details   polyethylene glycol (MIRALAX) 17 gram packet Take 17 g by mouth daily.      PRENAT 115-IRON FUM-FOLIC-DSS ORAL Take by mouth.                     ?? Diet:  normal diet as tolerated    ?? Activity: other, rest as much as possible    ?? Medications: continue previous home meds    ?? Other instructions: call office for decreased fetal movement, for leaking of fluid from vagina, for vaginal bleeding, for headache unrelieved by Tylenol extra strength, right upper quadrant pain, blurry vision, or for painful regular contractions every 5-7 minutes for 1-2 hours.    ?? Information provided to: Patient    ?? Follow up: Other scheduled c-section on December 15, 2016    Call physician or midwife, return to Labor and Delivery, call 911, or go to the nearest Emergency Room if: increased leakage or fluid, decreased fetal movement, persistent low back pain or cramping, bleeding from vaginal area, difficulty urinating, pain with urination, difficulty breathing, new calf pain or persistent headache

## 2016-12-10 NOTE — Unmapped (Addendum)
Patient admitted to Triage B for c/o feet turning purple, headaches and seeing spots.  States her blood pressure has been high for last 2 weeks, but denies being on antihypertensives or bedrest.  Reports active fetal movement, denies leaking fluid from vagina, denies vaginal bleeding.  States she's scheduled for primary c/s July 2nd for history of bilateral hip surgery.  Oriented to room, phone & call light in reach.  Hospital consent to treat obtained.  Feet are tan, pink, warm, dry pedal pulses present +2 bilaterally, no pitting edema to feet or ankles, denies ruq pain, denies blurry vision.

## 2016-12-10 NOTE — Unmapped (Signed)
Reviewed discharge instructions with patient, copy given to patient.  Labor precautions given and encouraged patient to call doctor's office for any questions or concerns.  Patient verbalized understanding of discharge instructions.  Now discharged home, ambulatory, undelivered in stable condition with family & all belongings.

## 2016-12-10 NOTE — Unmapped (Signed)
12/10/16 2230   Nonstress Test   Variability 6-25 BPM   Decelerations None   Accelerations Yes   Acoustic Stimulator No   Baseline 140 BPM   Uterine Irritability No   Contractions Not present   Interpretation   Nonstress Test Interpretation Reactive   Overall Impression Reassuring   Comments called nst findings to Dr Sheppard Penton

## 2016-12-11 LAB — CBC
Hematocrit: 35.5 % (ref 35.0–45.0)
Hemoglobin: 12.6 g/dL (ref 11.7–15.5)
MCH: 30.5 pg (ref 27.0–33.0)
MCHC: 35.5 g/dL (ref 32.0–36.0)
MCV: 85.9 fL (ref 80.0–100.0)
MPV: 8.5 fL (ref 7.5–11.5)
Platelets: 200 10*3/uL (ref 140–400)
RBC: 4.14 10*6/uL (ref 3.80–5.10)
RDW: 12.7 % (ref 11.0–15.0)
WBC: 10.6 10*3/uL (ref 3.8–10.8)

## 2016-12-11 LAB — COMPREHENSIVE METABOLIC PANEL
ALT: 10 U/L (ref 7–52)
AST: 13 U/L (ref 13–39)
Albumin: 3.3 g/dL — ABNORMAL LOW (ref 3.5–5.7)
Alkaline Phosphatase: 130 U/L — ABNORMAL HIGH (ref 36–125)
Anion Gap: 10 mmol/L (ref 3–16)
BUN: 12 mg/dL (ref 7–25)
CO2: 24 mmol/L (ref 21–33)
Calcium: 9.4 mg/dL (ref 8.6–10.3)
Chloride: 104 mmol/L (ref 98–110)
Creatinine: 0.67 mg/dL (ref 0.60–1.30)
GFR, Estimated: >90 See note.
GFR, Estimated: >90 See note.
Glucose: 89 mg/dL (ref 70–100)
Osmolality Calc: 285 mOsm/kg (ref 278–305)
Potassium: 4.2 mmol/L (ref 3.5–5.3)
Sodium: 138 mmol/L (ref 133–146)
Total Bilirubin: 0.3 mg/dL (ref 0.0–1.5)
Total Protein: 6.1 g/dL — ABNORMAL LOW (ref 6.4–8.9)

## 2016-12-11 LAB — URIC ACID: Uric Acid, Serum: 6.1 mg/dL (ref 3.8–8.7)

## 2016-12-14 ENCOUNTER — Inpatient Hospital Stay
Admit: 2016-12-14 | Discharge: 2016-12-16 | Disposition: A | Discharge status: Home / Self Care | Payer: PRIVATE HEALTH INSURANCE | Source: Ambulatory Visit

## 2016-12-14 LAB — CBC
Hematocrit: 38.3 % (ref 35.0–45.0)
Hemoglobin: 13 g/dL (ref 11.7–15.5)
MCH: 29.6 pg (ref 27.0–33.0)
MCHC: 33.8 g/dL (ref 32.0–36.0)
MCV: 87.4 fL (ref 80.0–100.0)
MPV: 8.7 fL (ref 7.5–11.5)
Platelets: 231 10E3/uL (ref 140–400)
RBC: 4.39 10E6/uL (ref 3.80–5.10)
RDW: 12.9 % (ref 11.0–15.0)
WBC: 11.9 10E3/uL — ABNORMAL HIGH (ref 3.8–10.8)

## 2016-12-14 LAB — ABO/RH: Rh Type: POSITIVE

## 2016-12-14 LAB — ANTIBODY SCREEN: Antibody Screen: NEGATIVE

## 2016-12-14 LAB — TREPONEMA PALLIDUM AB WITH REFLEX: Treponema Pallidum: NEGATIVE

## 2016-12-14 LAB — PERINATAL, HIV 1/2 AB+AG WITH REFLEX: HIV 1+2 AB/AGN: NONREACTIVE

## 2016-12-14 MED ORDER — citricacidsodiumcitrateBICITRAsolution15mL
500-334 | Freq: Once | ORAL | Status: AC
Start: 2016-12-14 — End: 2016-12-14
  Administered 2016-12-14: 08:00:00 15 mL via ORAL

## 2016-12-14 MED ORDER — lactated Ringers infusion
INTRAVENOUS | Status: AC | PRN
Start: 2016-12-14 — End: 2016-12-14
  Administered 2016-12-14: 08:00:00 125 mL/h via INTRAVENOUS

## 2016-12-14 MED ORDER — acetaminophen (TYLENOL) oral solution Soln 975 mg
650 | Freq: Four times a day (QID) | ORAL | Status: AC
Start: 2016-12-14 — End: 2016-12-14

## 2016-12-14 MED ORDER — lactated Ringers infusion
INTRAVENOUS | Status: AC | PRN
Start: 2016-12-14 — End: 2016-12-14
  Administered 2016-12-14: 09:00:00 via INTRAVENOUS

## 2016-12-14 MED ORDER — fentaNYL (SUBLIMAZE) injection 25 mcg
50 | INTRAMUSCULAR | Status: AC | PRN
Start: 2016-12-14 — End: 2016-12-14

## 2016-12-14 MED ORDER — methylergonovine (METHERGINE) injection 0.2 mg
0.2 | INTRAMUSCULAR | Status: AC | PRN
Start: 2016-12-14 — End: 2016-12-16

## 2016-12-14 MED ORDER — fentaNYL (SUBLIMAZE) 50 mcg/mL injection
50 | INTRAMUSCULAR | Status: AC
Start: 2016-12-14 — End: ?

## 2016-12-14 MED ORDER — lactated Ringers infusion
INTRAVENOUS | Status: AC
Start: 2016-12-14 — End: 2016-12-16
  Administered 2016-12-14 (×3): 125 mL/h via INTRAVENOUS

## 2016-12-14 MED ORDER — proMETHazine (PHENERGAN) injection 6.25 mg
25 | Freq: Four times a day (QID) | INTRAMUSCULAR | Status: AC | PRN
Start: 2016-12-14 — End: 2016-12-14

## 2016-12-14 MED ORDER — lactated Ringers infusion
INTRAVENOUS | Status: AC
Start: 2016-12-14 — End: 2016-12-14

## 2016-12-14 MED ORDER — oxytocin (PITOCIN) 20 units in lactated ringers 500 mL IV BOLUS
20 | INTRAVENOUS | Status: AC | PRN
Start: 2016-12-14 — End: 2016-12-16
  Administered 2016-12-14: 09:00:00 999 mL/h via INTRAVENOUS

## 2016-12-14 MED ORDER — ketorolac (TORADOL) injection 15 mg
15 | Freq: Four times a day (QID) | INTRAMUSCULAR | Status: AC | PRN
Start: 2016-12-14 — End: 2016-12-14

## 2016-12-14 MED ORDER — morphine injection Crtg 2 mg
2 | Freq: Once | INTRAVENOUS | Status: AC
Start: 2016-12-14 — End: 2016-12-14
  Administered 2016-12-14: 12:00:00 2 mg via INTRAVENOUS

## 2016-12-14 MED ORDER — ibuprofen (ADVIL,MOTRIN) 800 MG tablet
800 | ORAL_TABLET | Freq: Three times a day (TID) | ORAL | 0 refills | Status: AC | PRN
Start: 2016-12-14 — End: 2019-07-26

## 2016-12-14 MED ORDER — ondansetron (ZOFRAN) injection
4 | INTRAMUSCULAR | Status: AC | PRN
Start: 2016-12-14 — End: 2016-12-14
  Administered 2016-12-14: 08:00:00 4 via INTRAVENOUS

## 2016-12-14 MED ORDER — oxytocin (PITOCIN) injection
10 | INTRAMUSCULAR | Status: AC | PRN
Start: 2016-12-14 — End: 2016-12-14
  Administered 2016-12-14: 09:00:00 20 via INTRAVENOUS

## 2016-12-14 MED ORDER — fentaNYL (SUBLIMAZE) injection
50 | INTRAMUSCULAR | Status: AC | PRN
Start: 2016-12-14 — End: 2016-12-14
  Administered 2016-12-14: 08:00:00 15 via INTRATHECAL

## 2016-12-14 MED ORDER — carboprost (HEMABATE) injection 250 mcg
250 | INTRAMUSCULAR | Status: AC | PRN
Start: 2016-12-14 — End: 2016-12-16

## 2016-12-14 MED ORDER — fentaNYL (SUBLIMAZE) injection 50 mcg
50 | INTRAMUSCULAR | Status: AC | PRN
Start: 2016-12-14 — End: 2016-12-14

## 2016-12-14 MED ORDER — oxyCODONE (ROXICODONE) immediate release tablet 10 mg
5 | ORAL | Status: AC | PRN
Start: 2016-12-14 — End: 2016-12-16
  Administered 2016-12-15 – 2016-12-16 (×5): 10 mg via ORAL

## 2016-12-14 MED ORDER — acetaminophen (TYLENOL) tablet 650 mg
325 | ORAL | Status: AC | PRN
Start: 2016-12-14 — End: 2016-12-14

## 2016-12-14 MED ORDER — ibuprofen (ADVIL,MOTRIN) tablet 800 mg
800 | Freq: Three times a day (TID) | ORAL | Status: AC
Start: 2016-12-14 — End: 2016-12-16
  Administered 2016-12-14 – 2016-12-16 (×6): 800 mg via ORAL

## 2016-12-14 MED ORDER — lactated Ringers infusion
INTRAVENOUS | Status: AC | PRN
Start: 2016-12-14 — End: 2016-12-14
  Administered 2016-12-14 (×2): via INTRAVENOUS

## 2016-12-14 MED ORDER — ondansetron (ZOFRAN) injection 4 mg
4 | Freq: Four times a day (QID) | INTRAMUSCULAR | Status: AC | PRN
Start: 2016-12-14 — End: 2016-12-16
  Administered 2016-12-14: 15:00:00 4 mg via INTRAVENOUS

## 2016-12-14 MED ORDER — diphth,pertus(acell),tetanus (BOOSTRIX TDAP) 2.5-8-5 Lf-mcg-Lf/0.5mL syringe Syrg 0.5 mL
2.5-8-5 | Freq: Once | INTRAMUSCULAR | Status: AC
Start: 2016-12-14 — End: 2016-12-16

## 2016-12-14 MED ORDER — acetaminophen (TYLENOL) tablet 325 mg
325 | ORAL | Status: AC | PRN
Start: 2016-12-14 — End: 2016-12-14

## 2016-12-14 MED ORDER — docusate sodium (COLACE) capsule 100 mg
100 | Freq: Every day | ORAL | Status: AC
Start: 2016-12-14 — End: 2016-12-16
  Administered 2016-12-14 – 2016-12-16 (×3): 100 mg via ORAL

## 2016-12-14 MED ORDER — morphine (PF) 1 mg/mL injection
1 | INTRAMUSCULAR | Status: AC
Start: 2016-12-14 — End: ?

## 2016-12-14 MED ORDER — ketorolac (TORADOL) 30 mg/mL (1 mL) injection
30 | INTRAMUSCULAR | Status: AC
Start: 2016-12-14 — End: ?

## 2016-12-14 MED ORDER — ondansetron (ZOFRAN) 4 mg/2 mL injection
4 | INTRAMUSCULAR | Status: AC
Start: 2016-12-14 — End: ?

## 2016-12-14 MED ORDER — lactated Ringers 1,000 mL bolus
INTRAVENOUS | Status: AC | PRN
Start: 2016-12-14 — End: 2016-12-14

## 2016-12-14 MED ORDER — simethicone (MYLICON) chewable tablet 80 mg
80 | Freq: Four times a day (QID) | ORAL | Status: AC | PRN
Start: 2016-12-14 — End: 2016-12-16

## 2016-12-14 MED ORDER — flu vaccine (seasonal < 65 yr)(PF) IM 0.5 mL
INTRAMUSCULAR | Status: AC
Start: 2016-12-14 — End: 2016-12-14

## 2016-12-14 MED ORDER — ceFAZolin (ANCEF) IVPB 2 g in D5W (duplex)
2 | Freq: Once | INTRAVENOUS | Status: AC
Start: 2016-12-14 — End: 2016-12-14
  Administered 2016-12-14: 08:00:00 2 g via INTRAVENOUS

## 2016-12-14 MED ORDER — miSOPROStol (CYTOTEC) tablet 1,000 mcg
200 | ORAL | Status: AC | PRN
Start: 2016-12-14 — End: 2016-12-16

## 2016-12-14 MED ORDER — fentaNYL (SUBLIMAZE) injection 12.5 mcg
50 | INTRAMUSCULAR | Status: AC | PRN
Start: 2016-12-14 — End: 2016-12-14

## 2016-12-14 MED ORDER — ketorolac (TORADOL) injection 30 mg
30 | Freq: Four times a day (QID) | INTRAMUSCULAR | Status: AC | PRN
Start: 2016-12-14 — End: 2016-12-16

## 2016-12-14 MED ORDER — ketorolac (TORADOL) injection 30 mg
30 | INTRAMUSCULAR | Status: AC | PRN
Start: 2016-12-14 — End: 2016-12-14
  Administered 2016-12-14: 09:00:00 30 via INTRAVENOUS

## 2016-12-14 MED ORDER — morphine (PF) injection
1 | INTRAMUSCULAR | Status: AC | PRN
Start: 2016-12-14 — End: 2016-12-14
  Administered 2016-12-14: 08:00:00 .2 via INTRATHECAL

## 2016-12-14 MED ORDER — oxyCODONE (ROXICODONE) 5 MG immediate release tablet
5 | ORAL_TABLET | ORAL | 0 refills | 6.00000 days | Status: AC | PRN
Start: 2016-12-14 — End: 2016-12-21

## 2016-12-14 MED ORDER — bupivacaine (MARCAINE) injection
0.75 | INTRAMUSCULAR | Status: AC | PRN
Start: 2016-12-14 — End: 2016-12-14
  Administered 2016-12-14: 08:00:00 1.6 via INTRASPINAL

## 2016-12-14 MED ORDER — lidocaine (PF) 2% (20 mg/mL) 20 mg/mL (2 %) Soln
20 | INTRAMUSCULAR | Status: AC
Start: 2016-12-14 — End: ?

## 2016-12-14 MED ORDER — acetaminophen (TYLENOL) tablet 975 mg
325 | Freq: Four times a day (QID) | ORAL | Status: AC
Start: 2016-12-14 — End: 2016-12-16
  Administered 2016-12-14 – 2016-12-16 (×5): 975 mg via ORAL

## 2016-12-14 MED ORDER — oxytocin (PITOCIN) 20 units in lactated ringers 500 mL IV BOLUS
20 | INTRAVENOUS | Status: AC | PRN
Start: 2016-12-14 — End: 2016-12-16

## 2016-12-14 MED ORDER — bupivacaine 0.75% in dextrose 8.25% (intrathecal) (MARCAINE) 0.75 % (7.5 mg/mL) injection
0.75 | INTRAMUSCULAR | Status: AC
Start: 2016-12-14 — End: ?

## 2016-12-14 MED ORDER — lactated Ringers infusion
INTRAVENOUS | Status: AC
Start: 2016-12-14 — End: 2016-12-14
  Administered 2016-12-14: 07:00:00 1000

## 2016-12-14 MED ORDER — oxyCODONE (ROXICODONE) immediate release tablet 5 mg
5 | ORAL | Status: AC | PRN
Start: 2016-12-14 — End: 2016-12-16
  Administered 2016-12-14 – 2016-12-16 (×4): 5 mg via ORAL

## 2016-12-14 MED ORDER — diphenhydrAMINE (BENADRYL) capsule 25 mg
25 | Freq: Four times a day (QID) | ORAL | Status: AC | PRN
Start: 2016-12-14 — End: 2016-12-16
  Administered 2016-12-14: 21:00:00 25 mg via ORAL

## 2016-12-14 MED ORDER — oxytocin (PITOCIN) 20 units in lactated ringers 500 mL IV BOLUS
20 | INTRAVENOUS | Status: AC | PRN
Start: 2016-12-14 — End: 2016-12-14

## 2016-12-14 MED ORDER — famotidine (PF) (PEPCID) injection 20 mg
20 | Freq: Once | INTRAVENOUS | Status: AC
Start: 2016-12-14 — End: 2016-12-14
  Administered 2016-12-14: 08:00:00 20 mg via INTRAVENOUS

## 2016-12-14 MED ORDER — nalbuphine (NUBAIN) injection 5 mg
10 | INTRAMUSCULAR | Status: AC | PRN
Start: 2016-12-14 — End: 2016-12-14

## 2016-12-14 MED ORDER — ondansetron (ZOFRAN) injection 4 mg
4 | Freq: Three times a day (TID) | INTRAMUSCULAR | Status: AC | PRN
Start: 2016-12-14 — End: 2016-12-14

## 2016-12-14 MED ORDER — acetaminophen (ACETAMINOPHEN EXTRA STRENGTH) 500 MG tablet
500 | ORAL_TABLET | Freq: Four times a day (QID) | ORAL | 0 refills | 15.50000 days | Status: AC | PRN
Start: 2016-12-14 — End: 2019-07-26

## 2016-12-14 MED ORDER — lactated Ringers 1,000 mL bolus
INTRAVENOUS | Status: AC | PRN
Start: 2016-12-14 — End: 2016-12-16

## 2016-12-14 MED ORDER — diphenhydrAMINE (BENADRYL) injection 25 mg
50 | Freq: Four times a day (QID) | INTRAMUSCULAR | Status: AC | PRN
Start: 2016-12-14 — End: 2016-12-14
  Administered 2016-12-14: 10:00:00 25 mg via INTRAVENOUS

## 2016-12-14 MED FILL — DOCUSATE SODIUM 100 MG CAPSULE: 100 100 MG | ORAL | Qty: 1

## 2016-12-14 MED FILL — OXYCODONE 5 MG TABLET: 5 5 MG | ORAL | Qty: 1

## 2016-12-14 MED FILL — TYLENOL 325 MG TABLET: 325 325 mg | ORAL | Qty: 3

## 2016-12-14 MED FILL — DURAMORPH (PF) 1 MG/ML INJECTION SOLUTION: 1 1 mg/mL | INTRAMUSCULAR | Qty: 10

## 2016-12-14 MED FILL — DIPHENHYDRAMINE 50 MG/ML INJECTION SOLUTION: 50 50 mg/mL | INTRAMUSCULAR | Qty: 1

## 2016-12-14 MED FILL — LACTATED RINGERS INTRAVENOUS SOLUTION: 1000.00 1000.00 mL | INTRAVENOUS | Qty: 1000

## 2016-12-14 MED FILL — DIPHENHYDRAMINE 25 MG CAPSULE: 25 25 mg | ORAL | Qty: 1

## 2016-12-14 MED FILL — ONDANSETRON HCL (PF) 4 MG/2 ML INJECTION SOLUTION: 4 4 mg/2 mL | INTRAMUSCULAR | Qty: 2

## 2016-12-14 MED FILL — IBUPROFEN 800 MG TABLET: 800 800 MG | ORAL | Qty: 1

## 2016-12-14 MED FILL — BUPIVACAINE (PF) 0.75 % (7.5 MG/ML) IN 8.25 % DEXTROSE INJECTION: 0.75 0.75 % (7.5 mg/mL) | INTRAMUSCULAR | Qty: 2

## 2016-12-14 MED FILL — LACTATED RINGERS INTRAVENOUS SOLUTION: 125.00 125.00 mL/hr | INTRAVENOUS | Qty: 1000

## 2016-12-14 MED FILL — FENTANYL (PF) 50 MCG/ML INJECTION SOLUTION: 50 50 mcg/mL | INTRAMUSCULAR | Qty: 2

## 2016-12-14 MED FILL — LIDOCAINE (PF) 20 MG/ML (2 %) INJECTION SOLUTION: 20 20 mg/mL (2 %) | INTRAMUSCULAR | Qty: 5

## 2016-12-14 MED FILL — OXYTOCIN 20 UNITS IN LACTATED RINGERS 500 ML IV BOLUS: 20 20 units/1000 mL | INTRAVENOUS | Qty: 500

## 2016-12-14 MED FILL — KETOROLAC 30 MG/ML (1 ML) INJECTION SOLUTION: 30 30 mg/mL (1 mL) | INTRAMUSCULAR | Qty: 1

## 2016-12-14 MED FILL — CEFAZOLIN 2 GRAM/50 ML IN DEXTROSE (ISO-OSMOTIC) INTRAVENOUS PIGGYBACK: 2 2 gram/50 mL | INTRAVENOUS | Qty: 50

## 2016-12-14 MED FILL — FAMOTIDINE (PF) 20 MG/2 ML INTRAVENOUS SOLUTION: 20 20 mg/2 mL | INTRAVENOUS | Qty: 2

## 2016-12-14 MED FILL — SODIUM CITRATE-CITRIC ACID 500 MG-334 MG/5 ML ORAL SOLUTION: 500-334 500-334 mg/5 mL | ORAL | Qty: 15

## 2016-12-14 MED FILL — MORPHINE 2 MG/ML INTRAVENOUS CARTRIDGE: 2 2 mg/mL | INTRAVENOUS | Qty: 1

## 2016-12-14 MED FILL — LACTATED RINGERS INTRAVENOUS SOLUTION: INTRAVENOUS | Qty: 1000

## 2016-12-14 NOTE — Unmapped (Signed)
abd prep'd and anesthesia present at bedside.

## 2016-12-14 NOTE — Unmapped (Signed)
Dr. Barbaraann Share on the phone, aware pt did SROM @ 0145, clear fluid, positive fetal movement. Pt scheduled for c-sect on Monday morning. FHT's-125's, moderate variability with accels. Dr. Barbaraann Share stated to get pt ready for OR and she was heading to the hospital.

## 2016-12-14 NOTE — Unmapped (Signed)
Problem: C-Section Recovery Care  Goal: Urine output is 30 mL/hour or more  Outcome: Progressing      Problem: Knowledge Deficit      Goal: Patient/family/caregiver demonstrates understanding of disease process, treatment plan, medications, and discharge instructions  Complete learning assessment and assess knowledge base.   Outcome: Progressing      Problem: C-Section Postpartum Care  Goal: Moderate rubra without clots, no purulent discharge, no foul smelling lochia  Outcome: Progressing    Goal: Urine output is 30 mL/hour or more  Outcome: Progressing      Problem: Impaired physical mobility  Goal: Motor/sensory func for patient  Outcome: Progressing    Goal: Early mobilization is achieved  Outcome: Completed Date Met: 12/14/16    Goal: Ambulation with assistance  Outcome: Completed Date Met: 12/14/16      Problem: Pain  Goal: Patient's pain is progressing toward patient's stated pain goal  Assess and monitor patient's pain using appropriate pain scale. Collaborate with interdisciplinary team and initiate plan and interventions as ordered. Re-assess patient's pain level 30 - 60 minutes after pain management intervention.    Outcome: Progressing      Problem: Infection  Goal: Signs and symptoms of infections are decreased or avoided  Assess and monitor patient for signs and symptoms of infection such as redness, warmth, discharge, and increased body temperature. Monitor and report abnormal lab values (ex-CBC and diff, serum protein, serum albumin, and cultures).  Wash hands properly before and after each patient care activity. Utilize standard precautions and use personal protective equipment (PPE) as indicated. Ensure aseptic care of all intravenous lines and invasive tubes/drains. Obtain immunization and exposure to communicable diseases history. Collaborate with interdisciplinary team and initiate plan and interventions as ordered.   Outcome: Progressing

## 2016-12-14 NOTE — Unmapped (Signed)
Department of Obstetrics and Gynecology   Obstetrics History and Physical           CHIEF COMPLAINT:  Active labor     HISTORY OF PRESENT ILLNESS:      The patient is a 26 y.o. female at [redacted]w[redacted]d.  OB History     Gravida Para Term Preterm AB Living    1              SAB TAB Ectopic Multiple Live Births                   Patient presents with a chief complaint as above and is being admitted for active labor and SROM. She is now 8 cm. Discussed option of vaginal delivery and patient adamantly refuses and wants primary CS due to hip pain and limited range of motion. . Surgical risks and benefits discussed.    Estimated Due Date: Estimated Date of Delivery: 12/19/16     PRENATAL CARE:     Complicated by: none     PAST OB HISTORY:  OB History     Gravida Para Term Preterm AB Living    1              SAB TAB Ectopic Multiple Live Births                        Past Medical History:   History reviewed. No pertinent past medical history.    Past Surgical History:   Past Surgical History:   Procedure Laterality Date   ??? HIP SURGERY Bilateral    ??? HIP SURGERY Bilateral 2013       Allergies:  Patient has no known allergies.     Social History:    Social History     Social History   ??? Marital status: Married     Spouse name: N/A   ??? Number of children: N/A   ??? Years of education: N/A     Occupational History   ??? Not on file.     Social History Main Topics   ??? Smoking status: Never Smoker   ??? Smokeless tobacco: Never Used   ??? Alcohol use No   ??? Drug use: No   ??? Sexual activity: Yes     Partners: Male     Other Topics Concern   ??? Not on file     Social History Narrative   ??? No narrative on file       Family History:  No family history on file.     Medications Prior to Admission:  Prescriptions Prior to Admission   Medication Sig Dispense Refill Last Dose   ??? polyethylene glycol (MIRALAX) 17 gram packet Take 17 g by mouth daily.   Past Week at Unknown time   ??? PRENAT 115-IRON FUM-FOLIC-DSS ORAL Take by mouth.   12/10/2016 at Unknown time         REVIEW OF SYSTEMS:     Review of Systems - General ROS: all negative  Gastrointestinal ROS: positive for - abdominal pain  Genito-Urinary ROS: positive for - leaking vaginal fluid     PHYSICAL EXAM:  Vitals:    12/14/16 0245 12/14/16 0300   BP: (!) 152/98 (!) 152/98   Pulse: 101    Resp: 20    Temp: 98.2 ??F (36.8 ??C)    TempSrc: Oral    Weight: (!) 250 lb (113.4 kg)    Height: 5' 6 (1.676 m)  General appearance:  awake, alert, cooperative, no apparent distress, and appears stated age  Neurologic:  Awake, alert, oriented to name, place and time.   Lungs:  No increased work of breathing, good air exchange  Abdomen:  Soft, non tender, gravid, consistent with her gestational ageFetal heart rate:  Reassuring.  Pelvis:  Adequate pelvis  Cervix:  Contraction frequency:      Membranes:  ruptured, clear fluid     Labs:     Lab Results   Component Value Date    URICACID 6.1 12/10/2016     Lab Results   Component Value Date    GLUCOSE 89 12/10/2016    BUN 12 12/10/2016    CO2 24 12/10/2016    CREATININE 0.67 12/10/2016    K 4.2 12/10/2016    NA 138 12/10/2016    CL 104 12/10/2016    CALCIUM 9.4 12/10/2016    ALBUMIN 3.3 (L) 12/10/2016    PROT 6.1 (L) 12/10/2016    ALKPHOS 130 (H) 12/10/2016    ALT 10 12/10/2016    BILITOT 0.3 12/10/2016     Lab Results   Component Value Date    WBC 11.9 (H) 12/14/2016    HGB 13.0 12/14/2016    HCT 38.3 12/14/2016    MCV 87.4 12/14/2016    PLT 231 12/14/2016     Lab Results   Component Value Date    KETONESU Negative 04/25/2016    BLOODU Small (A) 04/25/2016    BILIRUBINUR Negative 04/25/2016    UROBILINOGEN <2.0 04/25/2016    PROTEINUA Negative 04/25/2016    NITRITE Negative 04/25/2016    LEUKOCYTESUR Negative 04/25/2016    PHUR 6.0 04/25/2016    LABSPEC 1.013 04/25/2016    CLARITYU Slightly-Cloudy (A) 04/25/2016    COLORU Yellow 04/25/2016          ASSESSMENT AND PLAN:     39+ weeks in active labor  H/O hip surgery, limited range of motion- for primary CS, declined vaginal birth  despite rapid progression of labor

## 2016-12-14 NOTE — Unmapped (Signed)
Pericare care performed, patient dangled at side of bed.  Patient ambulated to bedside chair.  Patient attempting to breastfeed at this time with assistance.  Education provided about hand expression and finger feeding EBM to infant.

## 2016-12-14 NOTE — Unmapped (Signed)
[place]  DEPARTMENT OF ANESTHESIOLOGY  PRE-PROCEDURAL EVALUATION    Kaitlyn Smith is a 26 y.o. year old female presenting for:    Procedure(s):  CESAREAN SECTION    Surgeon:   Lavone Neri, MD    Chief Complaint     Pregnancy    Review of Systems     Anesthesia Evaluation    Patient summary reviewed.       No history of anesthetic complications         Cardiovascular:    Hypertension (elevated BP the last 2 weeks but not requiring meds) is.    (-) valvular problems/murmurs, past MI, CABG/stent, dysrhythmias, angina, no hyperlipidemia.    Neuro/Muscoloskeletal/Psych:    (+) neuromuscular disease (prior hip dislocations and hip surgery).    (-) seizures, TIA, CVA.     Pulmonary:      (-) COPD, asthma, shortness of breath, recent URI, sleep apnea.       GI/Hepatic/Renal:      (-) GERD, liver disease, renal disease.    Endo/Other:        (-) diabetes mellitus, hypothyroidism, no anemia, no thrombocytopenia, no bleeding disorder.       Past Medical History     History reviewed. No pertinent past medical history.    Past Surgical History     Past Surgical History:   Procedure Laterality Date   ??? HIP SURGERY Bilateral    ??? HIP SURGERY Bilateral 2013       Family History     No family history on file.    Social History     Social History     Social History   ??? Marital status: Married     Spouse name: N/A   ??? Number of children: N/A   ??? Years of education: N/A     Occupational History   ??? Not on file.     Social History Main Topics   ??? Smoking status: Never Smoker   ??? Smokeless tobacco: Never Used   ??? Alcohol use No   ??? Drug use: No   ??? Sexual activity: Yes     Partners: Male     Other Topics Concern   ??? Not on file     Social History Narrative   ??? No narrative on file       Medications     Allergies:  No Known Allergies    Home Meds:  Prior to Admission medications as of 12/14/16 0244   Medication Sig Taking?   polyethylene glycol (MIRALAX) 17 gram packet Take 17 g by mouth daily.    PRENAT 115-IRON FUM-FOLIC-DSS ORAL Take  by mouth.        Inpatient Meds:  Scheduled:   ??? ceFAZolin (ANCEF) IVPB  2 g Intravenous Once     Continuous:   ??? bolus IV fluid     ??? lactated Ringers     ??? lactated Ringers 125 mL/hr (12/14/16 0339)       PRN: bolus IV fluid, lactated Ringers, oxytocin (PITOCIN) 20 units in lactated ringers 500 mL    Vital Signs     Wt Readings from Last 3 Encounters:   12/14/16 (!) 250 lb (113.4 kg)   12/10/16 (!) 250 lb (113.4 kg)   04/25/16 219 lb 3.2 oz (99.4 kg)     Ht Readings from Last 3 Encounters:   12/14/16 5' 6 (1.676 m)   12/10/16 5' 6 (1.676 m)   04/25/16 5' 6 (1.676 m)  Temp Readings from Last 3 Encounters:   12/14/16 98.2 ??F (36.8 ??C) (Oral)   12/10/16 98.9 ??F (37.2 ??C) (Oral)   04/25/16 97.8 ??F (36.6 ??C)     BP Readings from Last 3 Encounters:   12/14/16 (!) 152/98   12/10/16 128/80   04/25/16 128/74     Pulse Readings from Last 3 Encounters:   12/14/16 101   12/10/16 99   04/25/16 76     @LASTSAO2 (3)@    Physical Exam     Airway:     Mallampati: II  Mouth Opening: >2 FB  TM distance: > = 3 FB  Neck ROM: full  (-) no facial hair, neck not short      Dental:   - No obvious cracked, loose, chipped, or missing teeth.     Pulmonary:       Breath sounds clear to auscultation.       Cardiovascular:     Rhythm: regular  Rate: normal    Neuro/Musculoskeletal/Psych:    Mental status: alert.          Abdominal:       Current OB Status:       Other Findings:        Laboratory Data     Lab Results   Component Value Date    WBC 11.9 (H) 12/14/2016    HGB 13.0 12/14/2016    HCT 38.3 12/14/2016    MCV 87.4 12/14/2016    PLT 231 12/14/2016       No results found for: [facility]    Lab Results   Component Value Date    GLUCOSE 89 12/10/2016    BUN 12 12/10/2016    CO2 24 12/10/2016    CREATININE 0.67 12/10/2016    K 4.2 12/10/2016    NA 138 12/10/2016    CL 104 12/10/2016    CALCIUM 9.4 12/10/2016    ALBUMIN 3.3 (L) 12/10/2016    PROT 6.1 (L) 12/10/2016    ALKPHOS 130 (H) 12/10/2016    ALT 10 12/10/2016    BILITOT 0.3 12/10/2016        No results found for: PTT, INR    Lab Results   Component Value Date    PREGTESTUR POSITIVE (A) 04/25/2016    HCGQUANT 17,057.00 04/25/2016       Anesthesia Plan     ASA 2 - emergency       Female, current non-smoker and opiate use  Planned PONV prophylaxis.    Anesthesia Type:  spinal.     (I have reviewed this patient's pre-operative medical record and discussed the anesthetic care plan with the Nurse Anesthetist involved in providing anesthesia for this case.      Standard monitors, SAB)      Anesthetic plan and risks discussed with patient, spouse and family.    Plan, alternatives, and risks of anesthesia, including death, have been explained to and discussed with the patient/legal guardian.  By my assessment, the patient/legal guardian understands and agrees.  Scenario presented in detail.  Questions answered.    Use of blood products discussed with patient whom consented to blood products.   Plan discussed with CRNA and attending.

## 2016-12-14 NOTE — Unmapped (Signed)
Problem: C-Section Postpartum Care  Goal: Moderate rubra without clots, no purulent discharge, no foul smelling lochia  Outcome: Progressing      Problem: Impaired physical mobility  Goal: Motor/sensory func for patient  Outcome: Progressing    Goal: Ambulates independently  Outcome: Progressing      Problem: Pain  Goal: Patient's pain is progressing toward patient's stated pain goal  Assess and monitor patient's pain using appropriate pain scale. Collaborate with interdisciplinary team and initiate plan and interventions as ordered. Re-assess patient's pain level 30 - 60 minutes after pain management intervention.    Outcome: Progressing      Problem: Infection  Goal: Signs and symptoms of infections are decreased or avoided  Assess and monitor patient for signs and symptoms of infection such as redness, warmth, discharge, and increased body temperature. Monitor and report abnormal lab values (ex-CBC and diff, serum protein, serum albumin, and cultures).  Wash hands properly before and after each patient care activity. Utilize standard precautions and use personal protective equipment (PPE) as indicated. Ensure aseptic care of all intravenous lines and invasive tubes/drains. Obtain immunization and exposure to communicable diseases history. Collaborate with interdisciplinary team and initiate plan and interventions as ordered.   Outcome: Progressing

## 2016-12-14 NOTE — Unmapped (Signed)
Dr. Barbaraann Share present at bedside, SVE by Christine,RN pt 8-9cm, pt signed consents for c-section, pt being taken back to OR, now. Anesthesia has been in to see pt. Family remains at the bedside.

## 2016-12-14 NOTE — Progress Notes (Signed)
Patient arrived from L&D in wheelchair returned to bed stand by assist tolerated well.  Postpartum folder reviewed, family denies questions, side rails elevated times 2, call light within reach, spouse at bedside.

## 2016-12-14 NOTE — Unmapped (Signed)
Patient complaint of itching nose, face and arms, no rash, no hives, no redness noted, denies shortness of breath, see order for benadryl, continue plan of care

## 2016-12-14 NOTE — Unmapped (Signed)
Anesthesia Post Note    Patient: Kaitlyn Smith    Procedure(s) Performed: * No procedures listed *    Anesthesia type: spinal    Patient location: Labor and Delivery    Post pain: Adequate analgesia    Post assessment: no apparent anesthetic complications    Last Vitals:   Vitals:    12/14/16 0745 12/14/16 0800 12/14/16 0900 12/14/16 1115   BP: 148/69 134/80 130/75 133/81   BP Location:   Right arm    Patient Position:   Sitting    Pulse: 93 91 101 97   Resp: 12 24 19 18    Temp:   99.1 ??F (37.3 ??C)    TempSrc:   Oral    SpO2: 97% 99% 100%    Weight:       Height:            Post vital signs: stable    Level of consciousness: awake, alert  and oriented    Complications: None

## 2016-12-14 NOTE — Unmapped (Signed)
Labs sent.

## 2016-12-14 NOTE — Unmapped (Signed)
Pt to triage c/o SROM and contractions. S/O and mother are at bedside. FHM placed and assessment and VS done.

## 2016-12-14 NOTE — Unmapped (Signed)
Anesthesia Transfer of Care Note    Patient: Kaitlyn Smith  Procedure(s) Performed: c section    Patient location: Labor and Delivery    Anesthesia type: spinal    Airway Device on Arrival to PACU/ICU: Room Air    IV Access: Peripheral    Monitors Recommended to be Used During PACU/ICU: Standard Monitors    Outstanding Issues to Address: None    Level of Consciousness: awake, alert  and oriented    Post vital signs:    Vitals:    12/14/16 0521   BP: 142/75   Pulse: 91   Resp: 16   Temp:  Sats:   100%       Complications: None      Date 12/13/16 0700 - 12/14/16 0659(Not Admitted) 12/14/16 0700 - 12/15/16 0659   Shift 0700-1459 1500-2259 2300-0659 24 Hour Total 0700-1459 1500-2259 2300-0659 24 Hour Total   I  N  T  A  K  E   I.V.   1400  (12.3) 1400  (12.3)          Volume (mL) (lactated Ringers infusion)   1200 1200          Volume (mL) (lactated Ringers infusion)   200 200        Shift Total  (mL/kg)   1400  (12.3) 1400  (12.3)       O  U  T  P  U  T   Urine   200 200          Urine   200 200        Blood   750 750          Est Blood Loss   750 750        Shift Total  (mL/kg)   950  (8.4) 950  (8.4)       Weight (kg)   113.4 113.4 113.4 113.4 113.4 113.4

## 2016-12-14 NOTE — Unmapped (Signed)
OB Spinal      Reason for block: primary anesthetic        Patient location during procedure: OR  Performed preoperatively.  Pre-procedure Pain Score:    8              Patient position: sitting    Preanesthetic Checklist  Completed: patient identified, anesthesia consent given, pre-op evaluation, timeout performed, IV checked, risks and benefits discussed, monitors and equipment checked/attached to patient, patient being monitored and hand hygiene performed  Anesthesia risks / alternatives discussed pre-op  Questions answered / anesthesia plan accepted and patient agrees to proceed..    Timeout verification:  correct patient, correct procedure, correct site and allergies reviewed.    Spinal    Prep: ChloraPrep                Approach: midline    Start time: 12/14/2016 4:06 AM    Needle     Needle type: Pencan   Introducer used.    Needle gauge: 25 G    Needle length: 3.5 in  Level of placement:  L3-4    Free flow of CSF noted.  Number of attempts:  1                Medications:    Injection time:  12/14/2016 4:12 AM  Medications:  Spinal Bupivacaine 0.75%     Amount:  1.6 mL  Medications added:  fentanyl and hydromorphone  Fentanyl 15 mcg   Duramorph 0.2 mg           Assessment  Events:  blood not aspirated via needle, injection not painful, no injection resistance, no paresthesia with needle placement, no other event and no positive intravascular test dose                            Block Effect:  No Unexpected/Untoward Events            End time: 12/14/2016 4:12 AM                  Staffing  Anesthesiologist: Cletis Athens  Resident/CRNA: Milton Ferguson  Performed: CRNA

## 2016-12-14 NOTE — Unmapped (Signed)
Obstetrical Discharge Form     Gestational Age:[redacted]w[redacted]d     Antepartum complications: History of hip surgeries     Date of Delivery: 12/14/2016      Type of Delivery: Primary low transverse cesarean section     Delivered By:  Dr Huel Coventry        Baby: Female, Apgars 9/9 at 1/5 minutes. Weight 8 lb 8.7 oz (3875 grams)  Information for the patient's newborn:  [name], [name] [[id number]]        Anesthesia: spinal     Intrapartum complications: None     Postpartum complications: anemia     Discharge Medication:    Kaitlyn Smith, Kaitlyn Smith   Home Medication Instructions [id number]    Printed on:12/14/16 0535   Medication Information                      acetaminophen (ACETAMINOPHEN EXTRA STRENGTH) 500 MG tablet  Take 2 tablets (1,000 mg total) by mouth every 6 hours as needed.             ibuprofen (ADVIL,MOTRIN) 800 MG tablet  Take 1 tablet (800 mg total) by mouth every 8 hours as needed for Pain.             oxyCODONE (ROXICODONE) 5 MG immediate release tablet  Take 1-2 tablets (5-10 mg total) by mouth every 4 hours as needed for Pain for up to 7 days. Indications: Pain   Earliest Fill Date: 12/14/16             polyethylene glycol (MIRALAX) 17 gram packet  Take 17 g by mouth daily.             PRENAT 115-IRON FUM-FOLIC-DSS ORAL  Take by mouth.                  Kendell Bane Date: 12/16/2016    Discharged home in stable condition.     Plan:  Follow up in 6 week(s)   /

## 2016-12-14 NOTE — Unmapped (Signed)
Cesarean Section Procedure Note    Indications: 39+ weeks, active labor    Pre-operative Diagnosis: 39 week 2 day pregnancy.    Procedure: Primary Low transverse cesarean section    Post-operative Diagnosis: same    Surgeon: Pauletta Browns     Anesthesia: Spinal anesthesia    Findings: Viable infant female, Apgars 9/9 at 1/5 minutes, Weight 8 lb 8.7 oz (3875 grams)    Estimated Blood Loss:  750 cc              Specimens: None           Complications:  None; patient tolerated the procedure well.           Disposition: PACU - hemodynamically stable.           Condition: stable      Procedure Details   The patient was seen in triage with SROM and in active labor- 8 cm per RN. We discussed option of TOL , epidural and vaginal delivery which she adamantly declined due to a history of hip surgeries and limited ROM. A primary c section had been  planned. The risks, benefits, complications, treatment options, and expected outcomes were discussed with the patient.  The patient concurred with the proposed plan, giving informed consent.   The patient was taken to Operating Room and the procedure verified as C-Section Delivery. A Time Out was held and the above information confirmed.    After placement of spinal anesthesia, the patient was draped and prepped in the usual sterile manner. A foley catheter was placed. A Pfannenstiel incision was made and carried down through the subcutaneous tissue to the fascia. Fascial incision was made and extended transversely. The fascia was separated from the underlying rectus tissue superiorly and inferiorly. The peritoneum was identified and entered. Peritoneal incision was extended longitudinally. The Alexis self retaining retractor was placed. The utero-vesical peritoneal reflection was incised transversely and the bladder flap was bluntly freed from the lower uterine segment. A low transverse uterine incision was made and extended bilaterally with digital traction. . Delivered from  vertex presentation was a 3875 gram makle with Apgar scores of 9 at one minute and 9 at five minutes. After the umbilical cord was clamped and cut cord blood was obtained for evaluation. The placenta was removed intact and appeared normal. The uterus was cleansed of any remaining products of conception.  The uterine outline, tubes and ovaries appeared normal. The uterine incision was closed with 2  layers , the first layer was a running  stitch of 0 Vicryl. The second layer was an imbricating stitch. Hemostasis was observed. The uterus , tubes and ovaries were examined and were normal. The rectus and peritoneum were closed with a running 0 vicryl. The fascia was then reapproximated with running sutures of 0 Vicryl. The subcutaneous layer was closed with 3-0 running vicryl. The skin was reapproximated with 4-0 monocryl subcuticular stitch. .    Instrument, sponge, and needle counts were correct prior the abdominal closure and at the conclusion of the case.       Attending Attestation: I was present and scrubbed for the entire procedure.

## 2016-12-15 LAB — CBC
Hematocrit: 27.2 % (ref 35.0–45.0)
Hemoglobin: 9.3 g/dL (ref 11.7–15.5)
MCH: 29.6 pg (ref 27.0–33.0)
MCHC: 34 g/dL (ref 32.0–36.0)
MCV: 87.1 fL (ref 80.0–100.0)
MPV: 7.3 fL (ref 7.5–11.5)
Platelets: 182 10*3/uL (ref 140–400)
RBC: 3.13 10*6/uL (ref 3.80–5.10)
RDW: 12.9 % (ref 11.0–15.0)
WBC: 9.9 10*3/uL (ref 3.8–10.8)

## 2016-12-15 MED FILL — TYLENOL 325 MG TABLET: 325 325 mg | ORAL | Qty: 3

## 2016-12-15 MED FILL — OXYCODONE 5 MG TABLET: 5 5 MG | ORAL | Qty: 2

## 2016-12-15 MED FILL — DOCUSATE SODIUM 100 MG CAPSULE: 100 100 MG | ORAL | Qty: 1

## 2016-12-15 MED FILL — IBUPROFEN 800 MG TABLET: 800 800 MG | ORAL | Qty: 1

## 2016-12-15 MED FILL — OXYCODONE 5 MG TABLET: 5 5 MG | ORAL | Qty: 1

## 2016-12-15 NOTE — Lactation Note (Signed)
This note was copied from a baby's chart.  [id number]    LACTATION CONSULTATION    Lactation Consultation: Per referral by RN    Date of birth: 12/14/2014  Time of Birth: 4:32 AM  Gestational age: Gestational Age: [redacted]w[redacted]d Birth Weight: 8 lb 8.7 oz (3875 g) Weight Change from Birth: -7%   Infant was 3+% down when weighed initially on Post-partum unity       Maternal Assessment:     Maternal Data:26 year old G1 P1 who delivered via C/S.     Current Breastfeeding status:mother is using a nipple shield    Breastfeeding Hx:N/A    Breast Assessment:  Breast:large  Nipples:everted but flatten out when trying to latch  [place]:WNL  Breast Changes in Pregnancy:yes  Hx of Breast surgery:no  Maternal Risk Factors for Low Supply:none    Maternal Medications:PNV+, Miralax  Infant Assessment:    DOL:2  Feeding: Feeding Type: Mother's milk  I&O adequacy:yes  Urine output:  established   Stool output:  established  Percent weight change from birthweight: -7% (down 3+% when initially weighed on post partum unit)    Birth factors or dx that could create risk for breastfeeding:none  Oral Assessment:  Not assessed    Baby's Issues at birth:none    Intervention during consultation:   Interventions performed: assisted with breastfeeding, hand expression, lanolin provided and instructed, education and skin to skin  Latch & Positioning: Attempted to latch infant on right breast using the football hold.  Infant was very sleepy at breast.  Added nipple shield and still unable to sustain a latch.  Infant placed skin to skin and encouraged mother to try feeding again later.   Manual Expression: drops obtained  Bedside breast pump: discussed using a breastpump to assist in pulling out nipple prior to feeding.  Mother plans to think about pump.   Amount of milk expressed: N/A  Pump arrangements:mother has pump for home use    Education: latch and positioning, feeding frequency and feeding cues, expected output or adequate intake, feeding and diaper  log, STS and nipple shields.  Handout on use and care of nipple shield provided.  Infant feeding patterns the 1st and 2nd 24 hours of life, signs of good latch, signs of milk transfer, post-partum booklet as reference for after discharge, were discussed.         Feeding Plan:     1. Breastfeed on cue and at least 8 times/24hrs, unlimited timing. May not nurse this often in the first 24 hours. Change diaper and offer breastfeeding if greater than 3 hours since last feeding. Place infant Skin to Skin if infant will not breastfeed at three hours. After 45 minutes of Skin To Skin time with mother, if infant has not yet breastfed, use gentle stimulation and a diaper change to awaken infant and attempt again to breastfeed.   2. Hand express/ pump both breast after each feeding.  3. Supplement with breast milk when available or formula using alternative feeding methods: such as syring, cup, spoon. If using bottle pace feeding method preferred using small volumes and slow flow nipple.   Max 15 mls DOL 2   Max 30 mls DOL 3-5  4. Offer STS often while awake.   5. Maintain a feeding log until gaining and seen by HCP.  6. Use call light to request BF assistance from [facility] or RN as needed.    Mother's response: active in care   Chao Blazejewski J Zaira Iacovelli, CNP, IBCLC

## 2016-12-15 NOTE — Unmapped (Signed)
Pt. VSS, assessment WNL.  Pt up and voiding WNL.  Mother/baby bonding WNL.  Will continue to monitor

## 2016-12-15 NOTE — Unmapped (Signed)
Rev'd IS with pt, and encouraged her to do every hour.  Pt states+ understanding.

## 2016-12-15 NOTE — Unmapped (Signed)
Labor and Delivery Pain Management Follow-up Note    Patient: Kaitlyn Smith    Procedure(s) Performed: Section    Anesthesia type: spinal    Pain Assessment:  pain well controlled    Vital Signs:    Vitals:    12/15/16 1215   BP: 129/79   Pulse: 91   Resp: 16   Temp: 98.1 ??F (36.7 ??C)   SpO2:        OB Anesthesia Complications: None

## 2016-12-15 NOTE — Unmapped (Signed)
Problem: C-Section Recovery Care  Goal: Urine output is 30 mL/hour or more  Outcome: Completed Date Met: 12/15/16      Problem: C-Section Postpartum Care  Goal: Moderate rubra without clots, no purulent discharge, no foul smelling lochia  Outcome: Progressing    Goal: Patient is able to void/empty bladder after catheter is removed  Assess bladder and bladder function.   Outcome: Completed Date Met: 12/15/16    Goal: Urine output is 30 mL/hour or more  Outcome: Completed Date Met: 12/15/16      Problem: Impaired physical mobility  Goal: Motor/sensory func for patient  Outcome: Completed Date Met: 12/15/16    Goal: Ambulates independently  Outcome: Completed Date Met: 12/15/16      Problem: Pain  Goal: Patient's pain is progressing toward patient's stated pain goal  Assess and monitor patient's pain using appropriate pain scale. Collaborate with interdisciplinary team and initiate plan and interventions as ordered. Re-assess patient's pain level 30 - 60 minutes after pain management intervention.    Outcome: Progressing      Problem: Infection  Goal: Signs and symptoms of infections are decreased or avoided  Assess and monitor patient for signs and symptoms of infection such as redness, warmth, discharge, and increased body temperature. Monitor and report abnormal lab values (ex-CBC and diff, serum protein, serum albumin, and cultures).  Wash hands properly before and after each patient care activity. Utilize standard precautions and use personal protective equipment (PPE) as indicated. Ensure aseptic care of all intravenous lines and invasive tubes/drains. Obtain immunization and exposure to communicable diseases history. Collaborate with interdisciplinary team and initiate plan and interventions as ordered.   Outcome: Progressing

## 2016-12-15 NOTE — Lactation Note (Signed)
This note was copied from a baby's chart.  [id number]   Mother called for assistance with breastfeeding.  Attempted to latch infant on right breast using the football hold without shield but infant was frantic and unable to obtain a latch.  Nipple shield placed and infant breastfed intermittently for 15 minutes on right breast.  Infant was burped and repositioned on left breast using the nipple shield and football hold.  Infant was more vigorous at breast and mother stated she had cramping of her uterus during feeding.  Condensation was present in shield after feeding.  Mother was pleased.  Encouraged mother to seek assistance with latch if needed.  Harlene Petralia Recardo Evangelist, CNP, IBCLC

## 2016-12-15 NOTE — Unmapped (Signed)
This note was copied from a baby's chart.  1330-1400 Mother assisted with breastfeeding on both sides using the football hold and nipple shield.  Infant breastfed intermittently on left breast and consistently on right breast.  Mother states she felt cramping with feeding on each side.  No colostrum seen in shield yet. Encouraged mother to feed infant at least every 3 hours and more if he acts hungry.    Matej Sappenfield Recardo Evangelist, CNP, IBCLC

## 2016-12-15 NOTE — Progress Notes (Signed)
[facility] Post-Partum Rounding Note       Post-partum Day #1 s/p CS     Subjective:   Patient doing well. No complaints. Pain controlled. Tolerating Regular diet. Ambulating and voiding with no difficulty. No chest pains, no SOB. Flatus Yes. Bowel Movement No  Lochia: mild  Feeding: breast     Objective:  Vitals:    12/14/16 2005 12/15/16 0005 12/15/16 0443 12/15/16 0851   BP: 131/74 137/86 127/75 132/82   BP Location: Left arm      Patient Position: Lying      Pulse: 97 97 98 81   Resp: 16 16 16 18    Temp: 98.9 F (37.2 C) 98.7 F (37.1 C) 98.5 F (36.9 C) 97.7 F (36.5 C)   TempSrc: Oral Oral Oral    SpO2: 98% 98% 98%    Weight:       Height:            Physical Examination:  Appears well  Uterus: Firm, NT  Incision: C/D/I  Calves: NT, No edema, No signs of DVT     Lab Results   Component Value Date    WBC 9.9 12/15/2016    RBC 3.13 (L) 12/15/2016    HGB 9.3 (L) 12/15/2016    HCT 27.2 (L) 12/15/2016    MCV 87.1 12/15/2016    MCH 29.6 12/15/2016    MCHC 34.0 12/15/2016    RDW 12.9 12/15/2016    PLT 182 12/15/2016       Assessment: 26 y.o. G1P1001 s/p CS  Body mass index is 40.35 kg/m.    Plan:  Routine care.  Circ tomorrow.     Pamala Duffel Shalee Paolo  12/15/16

## 2016-12-16 MED ORDER — ferrous sulfate 325 (65 FE) MG tablet
325 | ORAL_TABLET | Freq: Every day | ORAL | 0 refills | Status: AC
Start: 2016-12-16 — End: ?

## 2016-12-16 MED FILL — TYLENOL 325 MG TABLET: 325 325 mg | ORAL | Qty: 3

## 2016-12-16 MED FILL — OXYCODONE 5 MG TABLET: 5 5 MG | ORAL | Qty: 2

## 2016-12-16 MED FILL — DOCUSATE SODIUM 100 MG CAPSULE: 100 100 MG | ORAL | Qty: 1

## 2016-12-16 MED FILL — IBUPROFEN 800 MG TABLET: 800 800 MG | ORAL | Qty: 1

## 2016-12-16 MED FILL — OXYCODONE 5 MG TABLET: 5 5 MG | ORAL | Qty: 1

## 2016-12-16 NOTE — Lactation Note (Signed)
This note was copied from a baby's chart.  At bedside to offer follow up assistance with breastfeeding.  MOB using nipple shield for latch. Encouraged MOB to use size 20 mm or size 24mm to allow for her nipple to evert.  MOB is concerned about using the nipple shield. However, infant is unable to achieve deep latch and milk transfer without it. Encouraged MOB to use nipple shield and allow infant time to wean from shield. Discussed ways to help infant learn to latch without it.  MOB started pumping overnight and has been provided EBM via bottle due to infant weight loss approaching 10%. Encouraged MOB to feed infant at breast prior to providing supplement. Discussed providing EBM via SNS and demonstrated use of SNS. Discouraged MOB from using artificial nipples at this time to allow infant to learn to latch well at breast.   Infant is slightly tongue tied and can move tongue to lips. Pediatrician has evaluated and encouraged family to have infant evaluated by ENT for frenotomy as needed. MOB expresses understanding.  Discussed sources of follow up support including [facility] offered weekly on Wednesdays. Offered continued LC support.

## 2016-12-16 NOTE — Unmapped (Signed)
[facility]    Obstetric Discharge Instructions        ACTIVITY:    ?? Avoid lifting anything over 10 pounds, driving, using the sweeper or leaving your home for an extended period of time. You may resume activities that you feel are appropriate                                                   ?? No exercise until you follow-up with your doctor                                             ?? You may go up and down stairs as necessary    ?? You should shower daily    ?? After your delivery, you may return to work/school/normal activity as directed by your physician on your follow-up visit    DIET:    ?? Maintain good dietary habits. Include the basic food groups: meats, dairy, cereals, fruits and vegetables    ?? If breastfeeding, increase your calories and fluids. See specific diet for breast feeding mothers located in your breastfeeding handouts    INCISION/WOUND CARE:    ?? Always use hand sanitizer or wash your hands before touching your incision    ?? Take a shower every day-avoid taking a tub bath, sitting in hot tubs or swimming in a pool for 6 weeks after surgery    ?? Wash your incision with soapy water and rinse with clean water at least once a day. You can let soapy water from the shower run over your incision. Gently pat the incision dry after showering    ?? If the pieces of tape on your incision do not fall off within 1 week, please remove them    ?? Keep your incision dry (except when washing it) and keep the folds of the skin around the incision dry. Expose the incision to as much air as possible    ?? Do not pick at the scabs-they help protect your incision    ?? If you use a binder, remove it 3-4 times a day to allow air to get to your incision    ?? If you are diabetic, you are at a higher risk for infection.-watch your incision closely and keep your blood sugars under control    ?? Take your medications exactly as they have been prescribed for you    ?? Avoid smoking and illegal drug use    ?? Call your  doctor if:  *Your incision becomes red, swollen, drains pus or is warm to the touch              LOCHIA/VAGINAL BLEEDING:    ?? Appearance can change from red to pink to white or from red to brown. This can last up to six weeks    ?? Call your doctor or midwife if the lochia (bloody discharge) is greenish or has a foul odor    ?? Call your doctor or midwife if the bleeding increases enough to cause soaking of a sanitary pad in an hour and/or if clots are passed that are larger than a small egg    BREASTFEEDING:    ?? Consult the breastfeeding handouts    ??  Wear a supportive bra with easy access to the breast    ?? Call your doctor or midwife immediately if you develop a hardened area that is red, sore and hot to the touch on your breast        [facility] Lactation Services:    During Office Hours - 7am-6pm    ?? Inpatient: [phone]    ?? Outpatient: [phone]    After Office Hours:    ?? [phone]      [facility] Lactation Services:    ??? Baby Caf?? at [facility] [phone]  ??? [facility] [phone]  ??? After hours  Lactation Consultant line [phone]    Community Resources:  [place] [employer]: [phone] or http://www.herring.[email or web address]/  WIC: (800) 755-GROW [phone]) or odh.ResidentialLock.ch  [place] WIC: (937) 628-8911 or nkyhealth.org  [place] Department of Health & Human Services:  [phone] or http://hoffman.[email or web address]/    BREAST CARE (non-breastfeeding):    ?? Wear a tight fitting supportive bra    ?? Avoid nipple stimulation if not breastfeeding    ?? Use ice packs to relieve discomfort if you become engorged (swollen and hard). Do not place ice packs directly on skin, wrap them in a towel first. Place then 20 minutes on and 20 minutes off breast to prevent rebound effect    ?? Breast pads are available at your drug store to prevent leaking through clothing    ?? Keep your back to warm water while showering; this prevents milk stimulation    SEX/BIRTH CONTROL:    ?? No sex, douching, or tampons for at least 6  weeks    ?? There are many methods of birth control available. Consult your doctor/midwife    SPECIAL INSTRUCTIONS: Please call your doctor IMMEDIATELY if you are having trouble with:    ?? Increased vaginal bleeding (more than one pad per hour)    ?? Foul smelling vaginal discharge    ?? Problems with bowel movements or urination    ?? Leg pains or increased swelling    ?? Shortness of breath    ?? Problems with breasts, especially redness, pain or engorgement    ?? Feeling depressed, tearful, unable to cope, having suicidal or homicidal thoughts    ?? Your temperature goes above 100.4    FOLLOW-UP:    ?? Call TODAY for an appointment or as soon as possible if an appointment hasn't already been made for you    ?? If a home health visit is arranged by the discharge coordinators - call if you have questions at [phone]        I understand that if any problems occur once I am at at home I am to contact my physician.    I understand and acknowledge receipt of the instructions indicated above.    I have verified Mother and Infant identification bands match at the time of discharge.              _____________________________________________________________    RN or Physician's signature Date/Time            _____________________________________________________________    Patient or Representative Signature Date/Time

## 2016-12-16 NOTE — Unmapped (Signed)
[facility] Post-Partum Rounding Note       Post-partum Day #2 s/p CS     Subjective:   Patient doing well. No complaints. Pain controlled. Tolerating Regular diet. Ambulating and voiding with no difficulty. No chest pains, no SOB. Flatus Yes. Bowel Movement No  Lochia: mild     Objective:  Vitals:    12/15/16 2015 12/16/16 0041 12/16/16 0414 12/16/16 0850   BP: 132/69 137/84 136/81 139/77   BP Location: Left arm Left leg Left arm Left arm   Patient Position: Sitting Sitting Sitting Sitting   Pulse: 90 88 94 102   Resp: 18 18 19 18    Temp: 98.2 ??F (36.8 ??C) 97.9 ??F (36.6 ??C) 98.4 ??F (36.9 ??C) 98.3 ??F (36.8 ??C)   TempSrc: Oral Oral Oral Oral   SpO2: 99% 99% 100% 99%   Weight:       Height:            Physical Examination:  Appears well  Uterus: Firm, NT  Incision: C/D/I  Calves: NT, No edema, No signs of DVT     Lab Results   Component Value Date    WBC 9.9 12/15/2016    RBC 3.13 (L) 12/15/2016    HGB 9.3 (L) 12/15/2016    HCT 27.2 (L) 12/15/2016    MCV 87.1 12/15/2016    MCH 29.6 12/15/2016    MCHC 34.0 12/15/2016    RDW 12.9 12/15/2016    PLT 182 12/15/2016       Assessment: 26 y.o. G1P1001 s/p CS  Body mass index is 40.35 kg/m??.    Plan:  D/C home with Fe    Shreyas Piatkowski f Lionel Woodberry  12/16/16

## 2016-12-16 NOTE — Unmapped (Signed)
Problem: C-Section Postpartum Care  Goal: Moderate rubra without clots, no purulent discharge, no foul smelling lochia  Outcome: Progressing      Problem: Pain  Goal: Patient's pain is progressing toward patient's stated pain goal  Assess and monitor patient's pain using appropriate pain scale. Collaborate with interdisciplinary team and initiate plan and interventions as ordered. Re-assess patient's pain level 30 - 60 minutes after pain management intervention.    Outcome: Progressing      Problem: Infection  Goal: Signs and symptoms of infections are decreased or avoided  Assess and monitor patient for signs and symptoms of infection such as redness, warmth, discharge, and increased body temperature. Monitor and report abnormal lab values (ex-CBC and diff, serum protein, serum albumin, and cultures).  Wash hands properly before and after each patient care activity. Utilize standard precautions and use personal protective equipment (PPE) as indicated. Ensure aseptic care of all intravenous lines and invasive tubes/drains. Obtain immunization and exposure to communicable diseases history. Collaborate with interdisciplinary team and initiate plan and interventions as ordered.   Outcome: Progressing

## 2016-12-16 NOTE — Unmapped (Signed)
This note was copied from a baby's chart.  At bedside to offer assistance with BF. Infant recently BF. Left contact information and encouraged MOB to call for [facility] when infant is ready to eat. MOB expresses understanding of LC support offered.

## 2018-12-21 LAB — HIV SCREEN
HIV ANTIGEN: NONREACTIVE
HIV Ag/Ab: NONREACTIVE
HIV-1 Antibody: NONREACTIVE
HIV-2 Ab: NONREACTIVE

## 2018-12-21 LAB — OBSTETRIC PANEL
Basophils %: 0.3 %
Basophils Absolute: 0 10*3/uL (ref 0.0–0.2)
Eosinophils %: 1 %
Eosinophils Absolute: 0.1 10*3/uL (ref 0.0–0.6)
Hematocrit: 42 % (ref 36.0–48.0)
Hemoglobin: 14.1 g/dL (ref 12.0–16.0)
Hep B S Ag Interp: NONREACTIVE
Lymphocytes %: 28.5 %
Lymphocytes Absolute: 2.2 10*3/uL (ref 1.0–5.1)
MCH: 29.1 pg (ref 26.0–34.0)
MCHC: 33.6 g/dL (ref 31.0–36.0)
MCV: 86.7 fL (ref 80.0–100.0)
MPV: 8 fL (ref 5.0–10.5)
Monocytes %: 8.8 %
Monocytes Absolute: 0.7 10*3/uL (ref 0.0–1.3)
Neutrophils %: 61.4 %
Neutrophils Absolute: 4.8 10*3/uL (ref 1.7–7.7)
Platelets: 251 10*3/uL (ref 135–450)
RBC: 4.85 M/uL (ref 4.00–5.20)
RDW: 12.8 % (ref 12.4–15.4)
Rubella Antibody IgG: 185.5 IU/mL
Total Syphillis IgG/IgM: NONREACTIVE
WBC: 7.8 10*3/uL (ref 4.0–11.0)

## 2018-12-21 LAB — TYPE AND SCREEN
ABO/Rh: A POS
Antibody Screen: NEGATIVE

## 2018-12-21 LAB — HEPATITIS C ANTIBODY: Hep C Ab Interp: NONREACTIVE

## 2018-12-22 LAB — CULTURE, URINE: Urine Culture, Routine: NO GROWTH

## 2019-01-11 ENCOUNTER — Inpatient Hospital Stay
Admit: 2019-01-11 | Discharge: 2019-01-11 | Disposition: A | Discharge status: Home / Self Care | Payer: PRIVATE HEALTH INSURANCE

## 2019-01-11 DIAGNOSIS — O219 Vomiting of pregnancy, unspecified: Secondary | ICD-10-CM

## 2019-01-11 LAB — DIFFERENTIAL
Basophils Absolute: 20 /uL (ref 0–200)
Basophils Relative: 0.2 % (ref 0.0–1.0)
Eosinophils Absolute: 50 /uL (ref 15–500)
Eosinophils Relative: 0.5 % (ref 0.0–8.0)
Lymphocytes Absolute: 2150 /uL (ref 850–3900)
Lymphocytes Relative: 21.5 % (ref 15.0–45.0)
Monocytes Absolute: 700 /uL (ref 200–950)
Monocytes Relative: 7 % (ref 0.0–12.0)
Neutrophils Absolute: 7080 /uL (ref 1500–7800)
Neutrophils Relative: 70.8 % (ref 40.0–80.0)
nRBC: 0 /100 WBC (ref 0–0)

## 2019-01-11 LAB — CBC
Hematocrit: 41.8 % (ref 35.0–45.0)
Hemoglobin: 14.9 g/dL (ref 11.7–15.5)
MCH: 29.7 pg (ref 27.0–33.0)
MCHC: 35.6 g/dL (ref 32.0–36.0)
MCV: 83.3 fL (ref 80.0–100.0)
MPV: 7.4 fL (ref 7.5–11.5)
Platelets: 220 10*3/uL (ref 140–400)
RBC: 5.02 10*6/uL (ref 3.80–5.10)
RDW: 12.4 % (ref 11.0–15.0)
WBC: 10 10*3/uL (ref 3.8–10.8)

## 2019-01-11 LAB — BASIC METABOLIC PANEL
Anion Gap: 8 mmol/L (ref 3–16)
BUN: 8 mg/dL (ref 7–25)
CO2: 25 mmol/L (ref 21–33)
Calcium: 8.9 mg/dL (ref 8.6–10.3)
Chloride: 103 mmol/L (ref 98–110)
Creatinine: 0.63 mg/dL (ref 0.60–1.30)
Glucose: 80 mg/dL (ref 70–100)
Osmolality, Calculated: 279 mOsm/kg (ref 278–305)
Potassium: 3.6 mmol/L (ref 3.5–5.3)
Sodium: 136 mmol/L (ref 133–146)
eGFR AA CKD-EPI: >90 See note.
eGFR NONAA CKD-EPI: >90 See note.

## 2019-01-11 MED ORDER — ondansetron (ZOFRAN) injection 4 mg
4 | Freq: Once | INTRAMUSCULAR | Status: AC
Start: 2019-01-11 — End: 2019-01-11
  Administered 2019-01-11: 20:00:00 4 mg via INTRAVENOUS

## 2019-01-11 MED ORDER — lactated Ringers 1,000 mL IV fluid
Freq: Once | INTRAVENOUS | Status: AC
Start: 2019-01-11 — End: 2019-01-11
  Administered 2019-01-11: 20:00:00 1000 mL via INTRAVENOUS

## 2019-01-11 MED ORDER — lactated Ringers 1,000 mL IV fluid
Freq: Once | INTRAVENOUS | Status: AC
Start: 2019-01-11 — End: 2019-01-11
  Administered 2019-01-11: 22:00:00 1000 mL via INTRAVENOUS

## 2019-01-11 MED FILL — LACTATED RINGERS INTRAVENOUS SOLUTION: 1000.00 1000.00 mL | INTRAVENOUS | Qty: 1000

## 2019-01-11 MED FILL — ONDANSETRON HCL (PF) 4 MG/2 ML INJECTION SOLUTION: 4 4 mg/2 mL | INTRAMUSCULAR | Qty: 2

## 2019-01-11 NOTE — ED Notes (Signed)
Pt has been d/c. Pt had no questions about her instructions. Pt piv pulled cath intact

## 2019-01-11 NOTE — ED Provider Notes (Addendum)
The [place] Department of Health has declared a state of emergency as a result of the coronavirus pandemic.      [place] ED Note    01/11/2019    Patient History     HPI: Kaitlyn Smith is a 28 y.o. female who presents with Morning Sickness  .  G2P1, [redacted] weeks pregnant by dates presents with nausea vomiting.  No focal complaints abdominal pain, no fever or chills.  No blood from above or below or melenic stools.  No dysuria, hematuria or frequency.  Patient has had significant nausea during this pregnancy and is planned to start more regular infusions at home, sent from [facility] clinic       History reviewed. No pertinent past medical history.    Past Surgical History:   Procedure Laterality Date    CESAREAN SECTION, CLASSIC N/A 12/14/2016    Procedure: CESAREAN SECTION;  Surgeon: Lavone Neri, MD;  Location: [facility] OB OR;  Service: Gynecology;  Laterality: N/A;    HIP SURGERY Bilateral     HIP SURGERY Bilateral 2013        reports that she has never smoked. She has never used smokeless tobacco. She reports that she does not drink alcohol or use drugs.    Previous Medications    ACETAMINOPHEN (ACETAMINOPHEN EXTRA STRENGTH) 500 MG TABLET    Take 2 tablets (1,000 mg total) by mouth every 6 hours as needed.    FERROUS SULFATE 325 (65 FE) MG TABLET    Take 1 tablet (325 mg total) by mouth daily with breakfast. Indications: Iron Deficiency Anemia    IBUPROFEN (ADVIL,MOTRIN) 800 MG TABLET    Take 1 tablet (800 mg total) by mouth every 8 hours as needed for Pain.    POLYETHYLENE GLYCOL (MIRALAX) 17 GRAM PACKET    Take 17 g by mouth daily.    PRENAT 115-IRON FUM-FOLIC-DSS ORAL    Take by mouth.       Allergies:   Allergies as of 01/11/2019    (No Known Allergies)       Review of Systems     ROS: See HPI for pertinent positives  All other ROS were negative.    Physical Exam     ED Triage Vitals [01/11/19 1543]   Vital Signs Group      Temp 99.3 F (37.4 C)      Temp Source Oral      Heart Rate 77      Heart Rate Source Monitor       Resp 16      SpO2 100 %      BP 111/65      MAP (mmHg)       BP Location Right arm      BP Method Automatic      Patient Position Sitting   SpO2 100 %   O2 Device None (Room air)     Vitals:    01/11/19 1543   BP: 111/65   BP Location: Right arm   Patient Position: Sitting   Pulse: 77   Resp: 16   Temp: 99.3 F (37.4 C)   TempSrc: Oral   SpO2: 100%   Weight: (!) 226 lb 12.8 oz (102.9 kg)   Height: 5' 6 (1.676 m)      Temp Readings from Last 2 Encounters:   01/11/19 99.3 F (37.4 C) (Oral)   12/16/16 98.3 F (36.8 C) (Oral)     BP Readings from Last 2 Encounters:   01/11/19  111/65   12/16/16 139/77     Pulse Readings from Last 2 Encounters:   01/11/19 77   12/16/16 102        Constitutional:  Quite well-appearing, no acute distress, non-toxic appearance   Eyes:  Sclera anicteric, conjunctiva normal.  HEENT:  Atraumatic, external ears normal, oropharynx moist.   Neck: normal range of motion, supple.  Respiratory:  No respiratory distress, No excessory muscle use  Cardiovascular:  Regular rate, 2+ pulse = BL radial.  GI:  Soft, nondistended, no rebound, no guarding.   Musculoskeletal:  No edema, no deformities.   Skin:  No rash or nodules noted.   Neurologic:  Awake and alert.  Moves all four extremities.  Light touch intact.    Psychiatric:  Normal mood.  Behavior appropriate.      Diagnostic Studies     Labs:      Labs Reviewed   CBC - Abnormal; Notable for the following components:       Result Value    MPV 7.4 (*)     All other components within normal limits   BASIC METABOLIC PANEL   DIFFERENTIAL   URINALYSIS W/RFL TO MICROSCOP, NO CULT       Radiology:  None      EKG interpretation:  None     Emergency Department Procedures         ED Course and MDM     Kaitlyn Smith is a 27 y.o. female who presented to the emergency department with ongoing nausea and several episodes of daily vomiting in early pregnancy.  Otherwise without pelvic cramping or abdominal pain no fevers or chills or other symptoms concerning for  urine infection.  Patient is given 2 L of IV fluid, IV Zofran and discharged home to continue outpatient plan for infusions at home or at infusion center.      Meds given in ED or prescribed for discharge:  Medications   lactated Ringers 1,000 mL IV fluid (has no administration in time range)   lactated Ringers 1,000 mL IV fluid (1,000 mLs Intravenous New Bag 01/11/19 1617)   ondansetron (ZOFRAN) injection 4 mg (4 mg Intravenous Given 01/11/19 1616)       Clinical Impression:  1. Nausea and vomiting during pregnancy        Patient Referred to:    Lamona Curl, MD  [address]  [place] [address]    Schedule an appointment as soon as possible for a visit       [facility] Emergency Department  [address]  [place] [address]  [phone]    If symptoms worsen      No future appointments.    Discharge Medications:  New Prescriptions    No medications on file             Critical Care Time (Attendings)        Trinidi Toppins, MD  01/11/19 1628       Sayde Lish, MD  01/11/19 1630       Morgaine Kimball, MD  01/11/19 1717

## 2019-01-11 NOTE — ED Triage Notes (Addendum)
PT STATES THAT SHE WAS SENT FROM OB/GYN FOR FLUIDS. SHE IS [redacted] WEEKS PREGNANT. HAS NAUSEA AND VOMITING

## 2019-01-11 NOTE — Unmapped (Signed)
Pt states she is [redacted]  Weeks pregnant.  Vominting for 2  Weeks. Oral mucosa appears moist.  Skin warm pink and dry. Resp easy. Alert and oriented x 4.

## 2019-05-06 LAB — CBC
Hematocrit: 36.9 % (ref 36.0–48.0)
Hemoglobin: 12.7 g/dL (ref 12.0–16.0)
MCH: 29.8 pg (ref 26.0–34.0)
MCHC: 34.3 g/dL (ref 31.0–36.0)
MCV: 87 fL (ref 80.0–100.0)
MPV: 8.5 fL (ref 5.0–10.5)
Platelets: 233 10*3/uL (ref 135–450)
RBC: 4.24 M/uL (ref 4.00–5.20)
RDW: 13.3 % (ref 12.4–15.4)
WBC: 11.5 10*3/uL — ABNORMAL HIGH (ref 4.0–11.0)

## 2019-05-06 LAB — GLUCOSE CHALLENGE GESTATIONAL: Gluc Chal: 101 mg/dL (ref <130)

## 2019-07-05 ENCOUNTER — Ambulatory Visit
Admit: 2019-07-05 | Discharge: 2019-07-05 | Payer: PRIVATE HEALTH INSURANCE | Attending: Family | Primary: Obstetrics & Gynecology

## 2019-07-05 DIAGNOSIS — Z20828 Contact with and (suspected) exposure to other viral communicable diseases: Secondary | ICD-10-CM

## 2019-07-05 NOTE — Patient Instructions (Signed)
You have received a viral test for COVID-19. Below is education on quarantine per the CDC guidelines. For any symptoms, seek care from your PCP, call [phone] to establish care with a doctor, or go directly to an urgent care or the emergency room.    Test results will take 2-7 days and will be sent to you in your MyChart account.  If you test positive, you will be contacted via phone.  If you test negative, the ONLY communication will be through MyChart.     GO TO [email or web address] AND SIGN UP FOR MyChart  (LOWER LEFT OF THE HOME PAGE)  No test is 100%. If you have symptoms, you should follow the guidance of quarantine as previously stated. You can still be contagious if you have symptoms.   Your county Health Department will reach out to you if you have a positive result. They will provide you with a return to work date and note.  If you were tested for a pre-op, then you should remain in quarantine until your procedure.   How do I know if I need to be in quarantine?  If you live in a community where COVID-19 is or might be spreading (currently, that is virtually everywhere in the [place])  Be alert for symptoms. Watch for fever, cough, shortness of breath, or other symptoms of COVID-19.  ??? Take your temperature if symptoms develop.  ??? Practice social distancing. Maintain 6 feet of distance from others and stay out of crowded places.  ??? Follow CDC guidance if symptoms develop.  If you feel healthy but:  ??? Recently had close contact with a person with COVID-19 you need to Quarantine:  ??? Stay home until 14 days after your last exposure.  ??? Check your temperature twice a day and watch for symptoms of COVID-19.  ??? If possible, stay away from people who are at higher-risk for getting very sick from COVID-19.  Stay Home and Monitor Your Health if you:  ??? Have been diagnosed with COVID-19, or  ??? Are waiting for test results, or  ??? Have cough, fever, or shortness of breath, or symptoms of COVID-19      When You Can  be Around Others After You Had or Likely Had COVID-19     If you have or think you might have COVID-19, it is important to stay home and away from other people. Staying away from others helps stop the spread of COVID-19. If you have an emergency warning sign (including trouble breathing), get emergency medical care immediately.  When you can be around others (end home isolation) depends on different factors for different situations.   Find CDC's recommendations for your situation below.  I think or know I had COVID-19, and I had symptoms  You can be with others after  ??? 3 days with no fever and  ??? Respiratory symptoms have improved (e.g. cough, shortness of breath) and  ??? 10 days since symptoms first appeared  Depending on your healthcare provider's advice and availability of testing, you might get tested to see if you still have COVID-19. If you will be tested, you can be around others when you have no fever, respiratory symptoms have improved, and you receive two negative test results in a row, at least 24 hours apart.  I tested positive for COVID-19 but had no symptoms  If you continue to have no symptoms, you can be with others after:  ??? 10 days have passed since test or 14 days   since your exposure test   Depending on your healthcare provider's advice and availability of testing, you might get tested to see if you still have COVID-19. If you will be tested, you can be around others after you receive two negative test results in a row, at least 24 hours apart.  If you develop symptoms after testing positive, follow the guidance above for ???I think or know I had COVID, and I had symptoms.???  For Anyone Who Has Been Around a Person with COVID-19  It is important to remember that anyone who has close contact with someone with COVID-19 should stay home for 14 days after exposure based on the time it takes to develop illness. Testing is not necessary.    www.cdc.gov/coronavirus/2019-ncov/index.html

## 2019-07-05 NOTE — Progress Notes (Signed)
Deborah Dean received a viral test for COVID-19. They were educated on isolation and quarantine as appropriate. For any symptoms, they were directed to seek care from their PCP, given contact information to establish with a doctor, directed to an urgent care or the emergency room.

## 2019-07-06 LAB — COVID-19: SARS-CoV-2: DETECTED — AB

## 2019-07-08 ENCOUNTER — Inpatient Hospital Stay
Admit: 2019-07-08 | Discharge: 2019-07-08 | Disposition: A | Discharge status: Home / Self Care | Payer: PRIVATE HEALTH INSURANCE

## 2019-07-08 ENCOUNTER — Emergency Department: Payer: PRIVATE HEALTH INSURANCE

## 2019-07-08 ENCOUNTER — Emergency Department: Admit: 2019-07-08 | Payer: PRIVATE HEALTH INSURANCE

## 2019-07-08 DIAGNOSIS — O98513 Other viral diseases complicating pregnancy, third trimester: Secondary | ICD-10-CM

## 2019-07-08 LAB — PROTIME-INR
INR: 0.9 (ref 0.9–1.1)
Protime: 11.9 s — ABNORMAL LOW (ref 12.1–15.1)

## 2019-07-08 LAB — BASIC METABOLIC PANEL
Anion Gap: 9 mmol/L (ref 3–16)
BUN: 11 mg/dL (ref 7–25)
CO2: 24 mmol/L (ref 21–33)
Calcium: 9 mg/dL (ref 8.6–10.3)
Chloride: 104 mmol/L (ref 98–110)
Creatinine: 0.66 mg/dL (ref 0.60–1.30)
Glucose: 103 mg/dL (ref 70–100)
Osmolality, Calculated: 284 mOsm/kg (ref 278–305)
Potassium: 3.5 mmol/L (ref 3.5–5.3)
Sodium: 137 mmol/L (ref 133–146)
eGFR AA CKD-EPI: >90 See note.
eGFR NONAA CKD-EPI: >90 See note.

## 2019-07-08 LAB — CBC
Hematocrit: 37.8 % (ref 35.0–45.0)
Hemoglobin: 12.9 g/dL (ref 11.7–15.5)
MCH: 29.8 pg (ref 27.0–33.0)
MCHC: 34.2 g/dL (ref 32.0–36.0)
MCV: 87.1 fL (ref 80.0–100.0)
MPV: 8.2 fL (ref 7.5–11.5)
Platelets: 144 10E3/uL (ref 140–400)
RBC: 4.34 10E6/uL (ref 3.80–5.10)
RDW: 13.1 % (ref 11.0–15.0)
WBC: 6.7 10E3/uL (ref 3.8–10.8)

## 2019-07-08 LAB — DIFFERENTIAL
Basophils Absolute: 13 /uL (ref 0–200)
Basophils Relative: 0.2 % (ref 0.0–1.0)
Eosinophils Absolute: 34 /uL (ref 15–500)
Eosinophils Relative: 0.5 % (ref 0.0–8.0)
Lymphocytes Absolute: 2204 /uL (ref 850–3900)
Lymphocytes Relative: 32.9 % (ref 15.0–45.0)
Monocytes Absolute: 576 /uL (ref 200–950)
Monocytes Relative: 8.6 % (ref 0.0–12.0)
Neutrophils Absolute: 3873 /uL (ref 1500–7800)
Neutrophils Relative: 57.8 % (ref 40.0–80.0)
nRBC: 0 /100{WBCs} (ref 0–0)

## 2019-07-08 LAB — APTT: aPTT: 31 s (ref 25.5–35.0)

## 2019-07-08 MED ORDER — ipratropium-albuteroL (DUO-NEB) 0.5 mg-3 mg(2.5 mg base)/3 mL nebulizer solution 3 mL
0.5 | Freq: Once | RESPIRATORY_TRACT | Status: AC
Start: 2019-07-08 — End: 2019-07-08
  Administered 2019-07-08: 23:00:00 3 mL via RESPIRATORY_TRACT

## 2019-07-08 MED ORDER — albuterol (PROVENTIL) nebulizer solution 2.5 mg
2.5 | Freq: Once | RESPIRATORY_TRACT | Status: AC
Start: 2019-07-08 — End: 2019-07-08
  Administered 2019-07-08: 23:00:00 2.5 mg via RESPIRATORY_TRACT

## 2019-07-08 MED ORDER — dexamethasone (PF) (DECADRON) injection Soln 10 mg
10 | Freq: Once | INTRAMUSCULAR | Status: AC
Start: 2019-07-08 — End: 2019-07-08
  Administered 2019-07-08: 23:00:00 10 mg via INTRAVENOUS

## 2019-07-08 MED ORDER — albuterol (PROVENTIL HFA) 90 mcg/actuation Inhl inhaler
90 | RESPIRATORY_TRACT | 0 refills | Status: AC | PRN
Start: 2019-07-08 — End: ?

## 2019-07-08 MED ORDER — predniSONE (DELTASONE) 20 MG tablet
20 | ORAL_TABLET | Freq: Every day | ORAL | 0 refills | Status: AC
Start: 2019-07-08 — End: 2019-07-13

## 2019-07-08 MED FILL — IPRATROPIUM 0.5 MG-ALBUTEROL 3 MG (2.5 MG BASE)/3 ML NEBULIZATION SOLN: 0.5 0.5 mg-3 mg(2.5 mg base)/3 mL | RESPIRATORY_TRACT | Qty: 3

## 2019-07-08 MED FILL — ALBUTEROL SULFATE 2.5 MG/3 ML (0.083 %) SOLUTION FOR NEBULIZATION: 2.5 2.5 mg /3 mL (0.083 %) | RESPIRATORY_TRACT | Qty: 3

## 2019-07-08 MED FILL — DEXAMETHASONE SODIUM PHOSPHATE (PF) 10 MG/ML INJECTION SOLUTION: 10 10 mg/mL | INTRAMUSCULAR | Qty: 1

## 2019-07-08 NOTE — ED Triage Notes (Signed)
HR 94 and Spo2 97%

## 2019-07-08 NOTE — Unmapped (Signed)
[facility] [place] ED Note    Date of service:  07/08/2019    Reason for Visit: Shortness of Breath and Cough      Patient History     HPI Kaitlyn Smith is a 29 year old female who is G2 P1 Ab0 that is [redacted] weeks gestation who presents to the emergency department for evaluation of cough, wheezing and being positive for covid  19.  The patient was diagnosed 3 days agoand presents with exertional dyspnea.  The patient denies any night sweats or fever.  The patient reports no chest pain.  She states she has had intermittent sensation of decreased fetal movement with cough and wheezing.  The patient further reason was referred for fetal monitoring to the emergency department   History reviewed. No pertinent past medical history.    Past Surgical History:   Procedure Laterality Date   ??? CESAREAN SECTION, CLASSIC N/A 12/14/2016    Procedure: CESAREAN SECTION;  Surgeon: Lavone Neri, MD;  Location: [facility] OB OR;  Service: Gynecology;  Laterality: N/A;   ??? HIP SURGERY Bilateral    ??? HIP SURGERY Bilateral 2013       Patient  reports that she has never smoked. She has never used smokeless tobacco. She reports that she does not drink alcohol or use drugs.      Previous Medications    ACETAMINOPHEN (ACETAMINOPHEN EXTRA STRENGTH) 500 MG TABLET    Take 2 tablets (1,000 mg total) by mouth every 6 hours as needed.    FERROUS SULFATE 325 (65 FE) MG TABLET    Take 1 tablet (325 mg total) by mouth daily with breakfast. Indications: Iron Deficiency Anemia    IBUPROFEN (ADVIL,MOTRIN) 800 MG TABLET    Take 1 tablet (800 mg total) by mouth every 8 hours as needed for Pain.    POLYETHYLENE GLYCOL (MIRALAX) 17 GRAM PACKET    Take 17 g by mouth daily.    PRENAT 115-IRON FUM-FOLIC-DSS ORAL    Take by mouth.       Allergies:   Allergies as of 07/08/2019   ??? (No Known Allergies)      family history has been reviewed in electronic medical record  Review of Systems     Review of Systems  Constitutional:See  HPI  Eyes:  Denies change in visual acuity   HENT:  Denies nasal congestion or sore throat   Respiratory:  Denies cough or shortness of breath   Cardiovascular:  Denies chest pain or edema   GI:See HPI  GU:  Denies dysuria   Musculoskeletal:  Denies back pain or joint pain   Integument:  Denies rash   Neurologic:  Denies headache, focal weakness or sensory changes   Endocrine:  Denies polyuria or polydipsia   Lymphatic:  Denies swollen glands   Psychiatric:  Denies depression or anxiety       Physical Exam     ED Triage Vitals [07/08/19 1659]   Vital Signs Group      Temp 98.1 ??F (36.7 ??C)      Temp Source Oral      Heart Rate 85      Heart Rate Source Monitor      Resp 18      SpO2 99 %      BP (!) 136/98      MAP (mmHg)       BP Location Right arm      BP Method Automatic      Patient Position Lying   SpO2  99 %   O2 Device None (Room air)       ED Physical Exam  Constitutional:  Well developed, well nourished, no acute distress, non-toxic appearance   Eyes:  PERRL, conjunctiva normal sclera are white and extraocular movements are intact  HENT:  Atraumatic, external ears normal.  TMs are clear bilateral, nose normal, oropharynx moist, no pharyngeal exudates. Neck- normal range of motion, no tenderness, supple   Respiratory:  No respiratory distress, normal breath sounds, no rales, positive end expiratory wheezing   Cardiovascular:  Normal rate, normal rhythm, no murmurs, no gallops, no rubs   GI: gravid uterus , normal bowel sounds, nontender, no organomegaly, no mass, no rebound, no guarding   GU:  No costovertebral angle tenderness   Musculoskeletal:  No edema, no tenderness, no deformities. Back- no tenderness  Integument:  Well hydrated, no rash   Neurologic:  Alert & oriented x 3, CN 2-12 normal, normal motor function, normal sensory function, no focal deficits noted         Diagnostic Studies     Labs:    Please see electronic medical record for any tests performed in the ED    Radiology:    Please see  electronic medical record for any tests performed in the ED    EKG:    No EKG Performed    Emergency Department Procedures     Procedures none    ED Course and MDM     Kaitlyn Smith is a 29 y.o. female who presented to the emergency department with Shortness of Breath and Cough  the patient presented to the emergency for evaluation and assessment for concerns of Covid 19 and wheezing.the patient on presentation was consulted to labor and delivery for fetal maternal monitoring.  The patient was initiated on HHN breathing treatments for her wheezing. and received IV Decadron.the patient in addition had initial screening labs obtained with chest x-ray with shielding of her abdomen. The patient had fetal maternal monitoring per OB protocol with no acute abnormality noted on monitoring.the patient's CBC, renal and PT/PTT are normal. CXR was negative. The patient had clinical improvement and appears stable for outpatient follow-up will be discharged on albuterol and prednisone for outpatient follow-up for Covid 19         Critical Care Time (Attendings)   None    Final Impression is Covid 19 and wheezing  Disposition is Discharge to home       Cherly Anderson, MD  07/08/19 1816

## 2019-07-08 NOTE — ED Triage Notes (Signed)
Pt is 33-[redacted] weeks pregnant and COVID +. SOB, cough are worsening and pt was sent to ED by OB.

## 2019-07-08 NOTE — Unmapped (Signed)
Pt in ED for treatment for COVID S&S, tested positive 07/06/19. Denies loss of fluid, contractions, vaginal bleeding. States unsure if fetal movement is normal d/t cough. Placed on EFM & TOCO. FHT's reactive and reassuring, fetal movement audible on EFM. Abd. soft and nontender. Reported to Dr Hal Neer, no new orders for OB

## 2019-07-12 NOTE — Unmapped (Signed)
34+4 wk NST, COVID +, decreased fetal movement. Reviewed over phone with Dr Barbaraann Share, okay to D/C

## 2019-07-12 NOTE — Unmapped (Signed)
Triage/Antepartum/Home Undelivered Discharge Instructions    After Discharge Orders:    No future appointments.    Call office to schedule an appointment for 07/18/19 or after    Current Discharge Medication List      CONTINUE these medications which have NOT CHANGED    Details   acetaminophen (ACETAMINOPHEN EXTRA STRENGTH) 500 MG tablet Take 2 tablets (1,000 mg total) by mouth every 6 hours as needed.  Qty: 60 tablet, Refills: 0    Associated Diagnoses: Status post primary low transverse cesarean section      albuterol (PROVENTIL HFA) 90 mcg/actuation Inhl inhaler Inhale 2 puffs into the lungs every 4 hours as needed for Wheezing or Shortness of Breath.  Qty: 1 Inhaler, Refills: 0      polyethylene glycol (MIRALAX) 17 gram packet Take 17 g by mouth daily.      predniSONE (DELTASONE) 20 MG tablet Take 3 tablets (60 mg total) by mouth daily for 5 days.  Qty: 15 tablet, Refills: 0      PRENAT 115-IRON FUM-FOLIC-DSS ORAL Take by mouth.      ferrous sulfate 325 (65 FE) MG tablet Take 1 tablet (325 mg total) by mouth daily with breakfast. Indications: Iron Deficiency Anemia  Qty: 30 tablet, Refills: 0    Comments: OTC      ibuprofen (ADVIL,MOTRIN) 800 MG tablet Take 1 tablet (800 mg total) by mouth every 8 hours as needed for Pain.  Qty: 30 tablet, Refills: 0    Associated Diagnoses: Status post primary low transverse cesarean section                     ?? Diet:  normal diet as tolerated    ?? Activity: normal activity as tolerated    ?? Medications: continue previous home meds    ?? Other instructions: Drink at least 60-80 ounces of water each day    ?? Information provided to: Patient    ?? Follow up: Clinic/MD office    Call physician or midwife, return to Labor and Delivery, call 911, or go to the nearest Emergency Room if: increased leakage or fluid, contractions more than  6 per  1 hour, decreased fetal movement, persistent low back pain or cramping or bleeding from vaginal area

## 2019-07-12 NOTE — Unmapped (Signed)
Discharge instructions reviewed per RN. Pt given written instructions, V/U, signed AVS. Discharged to home ambulatory in stable condition

## 2019-07-25 ENCOUNTER — Inpatient Hospital Stay
Admit: 2019-07-25 | Discharge: 2019-07-28 | Disposition: A | Discharge status: Home / Self Care | Payer: PRIVATE HEALTH INSURANCE | Source: Ambulatory Visit

## 2019-07-25 DIAGNOSIS — O114 Pre-existing hypertension with pre-eclampsia, complicating childbirth: Secondary | ICD-10-CM

## 2019-07-25 LAB — PROTEIN / CREATININE RATIO, URINE
Creatinine, Urine: 66.43 mg/dL
Prot/Creat Ratio, Ur: 0.87 ratio
Total Protein, Ur: 58 mg/dL

## 2019-07-25 LAB — URINALYSIS W/RFL TO MICROSCOPIC
Bilirubin, UA: NEGATIVE
Blood, UA: NEGATIVE
Glucose, UA: NEGATIVE mg/dL
Ketones, UA: NEGATIVE mg/dL
Leukocytes, UA: NEGATIVE
Nitrite, UA: NEGATIVE
Protein, UA: 100 mg/dL
RBC, UA: 1 /HPF (ref 0–3)
Specific Gravity, UA: 1.014 (ref 1.005–1.035)
Squam Epithel, UA: 1 /HPF (ref 0–5)
Urobilinogen, UA: <2 mg/dL (ref 0.2–1.9)
WBC, UA: 2 /HPF (ref 0–5)
pH, UA: 6 (ref 5.0–8.0)

## 2019-07-25 LAB — COMPREHENSIVE METABOLIC PANEL
ALT: 13 U/L (ref 7–52)
AST: 15 U/L (ref 13–39)
Albumin: 3.7 g/dL (ref 3.5–5.7)
Alkaline Phosphatase: 83 U/L (ref 36–125)
Anion Gap: 9 mmol/L (ref 3–16)
BUN: 14 mg/dL (ref 7–25)
CO2: 24 mmol/L (ref 21–33)
Calcium: 10 mg/dL (ref 8.6–10.3)
Chloride: 103 mmol/L (ref 98–110)
Creatinine: 0.67 mg/dL (ref 0.60–1.30)
Glucose: 82 mg/dL (ref 70–100)
Osmolality, Calculated: 282 mOsm/kg (ref 278–305)
Potassium: 4 mmol/L (ref 3.5–5.3)
Sodium: 136 mmol/L (ref 133–146)
Total Bilirubin: 0.3 mg/dL (ref 0.0–1.5)
Total Protein: 6.8 g/dL (ref 6.4–8.9)
eGFR AA CKD-EPI: >90 See note.
eGFR NONAA CKD-EPI: >90 See note.

## 2019-07-25 LAB — CBC
Hematocrit: 37.9 % (ref 35.0–45.0)
Hemoglobin: 13.3 g/dL (ref 11.7–15.5)
MCH: 29.8 pg (ref 27.0–33.0)
MCHC: 35 g/dL (ref 32.0–36.0)
MCV: 85 fL (ref 80.0–100.0)
MPV: 8.9 fL (ref 7.5–11.5)
Platelets: 190 10E3/uL (ref 140–400)
RBC: 4.46 10E6/uL (ref 3.80–5.10)
RDW: 13.3 % (ref 11.0–15.0)
WBC: 10.5 10E3/uL (ref 3.8–10.8)

## 2019-07-25 LAB — URIC ACID: Uric Acid: 5.9 mg/dL (ref 3.8–8.7)

## 2019-07-25 MED ORDER — acetaminophen (TYLENOL) tablet 650 mg
325 | Freq: Once | ORAL | Status: AC
Start: 2019-07-25 — End: 2019-07-25
  Administered 2019-07-26: 02:00:00 650 mg via ORAL

## 2019-07-25 MED FILL — TYLENOL 325 MG TABLET: 325 325 mg | ORAL | Qty: 2

## 2019-07-25 NOTE — Unmapped (Signed)
Indication for NST: pregnancy-induced hypertension  GA:35.3  Endorses fetal movement:yes    Time start:2052  Time end:2117    FHR Baseline:115  Variability:moderate  Accelerations:Present  Decelerations:Absent    Uterine Contractions:irregular, every 13 minutes  Resting tone:soft    Comments:    Fetal Kick counts reviewed: yes  Provider reviewed tracing: yes

## 2019-07-26 LAB — CBC
Hematocrit: 38 % (ref 35.0–45.0)
Hematocrit: 38 % (ref 35.0–45.0)
Hematocrit: 37.1 % (ref 35.0–45.0)
Hematocrit: 37.1 % (ref 35.0–45.0)
Hemoglobin: 13.4 g/dL (ref 11.7–15.5)
Hemoglobin: 13.4 g/dL (ref 11.7–15.5)
Hemoglobin: 12.9 g/dL (ref 11.7–15.5)
Hemoglobin: 12.9 g/dL (ref 11.7–15.5)
MCH: 30.1 pg (ref 27.0–33.0)
MCH: 30.1 pg (ref 27.0–33.0)
MCH: 29.6 pg (ref 27.0–33.0)
MCH: 29.6 pg (ref 27.0–33.0)
MCHC: 35.3 g/dL (ref 32.0–36.0)
MCHC: 35.3 g/dL (ref 32.0–36.0)
MCHC: 34.6 g/dL (ref 32.0–36.0)
MCHC: 34.6 g/dL (ref 32.0–36.0)
MCV: 85.2 fL (ref 80.0–100.0)
MCV: 85.2 fL (ref 80.0–100.0)
MCV: 85.7 fL (ref 80.0–100.0)
MCV: 85.7 fL (ref 80.0–100.0)
MPV: 8.6 fL (ref 7.5–11.5)
MPV: 8.6 fL (ref 7.5–11.5)
MPV: 8.4 fL (ref 7.5–11.5)
MPV: 8.4 fL (ref 7.5–11.5)
Platelets: 173 10*3/uL (ref 140–400)
Platelets: 173 10*3/uL (ref 140–400)
Platelets: 190 10*3/uL (ref 140–400)
Platelets: 190 10*3/uL (ref 140–400)
RBC: 4.46 10*6/uL (ref 3.80–5.10)
RBC: 4.46 10*6/uL (ref 3.80–5.10)
RBC: 4.33 10*6/uL (ref 3.80–5.10)
RBC: 4.33 10*6/uL (ref 3.80–5.10)
RDW: 13.6 % (ref 11.0–15.0)
RDW: 13.6 % (ref 11.0–15.0)
RDW: 13.4 % (ref 11.0–15.0)
RDW: 13.4 % (ref 11.0–15.0)
WBC: 9.6 10*3/uL (ref 3.8–10.8)
WBC: 9.6 10*3/uL (ref 3.8–10.8)
WBC: 8.5 10*3/uL (ref 3.8–10.8)
WBC: 8.5 10*3/uL (ref 3.8–10.8)

## 2019-07-26 LAB — COMPREHENSIVE METABOLIC PANEL
ALT: 12 U/L (ref 7–52)
ALT: 12 U/L (ref 7–52)
AST: 14 U/L (ref 13–39)
AST: 14 U/L (ref 13–39)
Albumin: 3.5 g/dL (ref 3.5–5.7)
Alkaline Phosphatase: 79 U/L (ref 36–125)
Alkaline Phosphatase: 79 U/L (ref 36–125)
Anion Gap: 8 mmol/L (ref 3–16)
Anion Gap: 8 mmol/L (ref 3–16)
BUN: 14 mg/dL (ref 7–25)
BUN: 14 mg/dL (ref 7–25)
CO2: 25 mmol/L (ref 21–33)
CO2: 25 mmol/L (ref 21–33)
Calcium: 9.2 mg/dL (ref 8.6–10.3)
Calcium: 9.2 mg/dL (ref 8.6–10.3)
Chloride: 105 mmol/L (ref 98–110)
Chloride: 105 mmol/L (ref 98–110)
Creatinine: 0.67 mg/dL (ref 0.60–1.30)
Creatinine: 0.67 mg/dL (ref 0.60–1.30)
Est, Glom Filt Rate: >90 See note.
Est, Glom Filt Rate: >90 See note.
Glucose: 84 mg/dL (ref 70–100)
Glucose: 84 mg/dL (ref 70–100)
Osmolality Calc: 286 mOsm/kg (ref 278–305)
Osmolality, Calculated: 286 mOsm/kg (ref 278–305)
Potassium: 3.9 mmol/L (ref 3.5–5.3)
Potassium: 3.9 mmol/L (ref 3.5–5.3)
Sodium: 138 mmol/L (ref 133–146)
Sodium: 138 mmol/L (ref 133–146)
Total Bilirubin: 0.3 mg/dL (ref 0.0–1.5)
Total Bilirubin: 0.3 mg/dL (ref 0.0–1.5)
Total Protein: 6.3 g/dL (ref 6.4–8.9)
Total Protein: 6.3 g/dL — ABNORMAL LOW (ref 6.4–8.9)
eGFR AA CKD-EPI: >90 See note.
eGFR NONAA CKD-EPI: >90 See note.

## 2019-07-26 LAB — ABO/RH
Rh Factor: POSITIVE
Rh Type: POSITIVE

## 2019-07-26 LAB — URINE DRUG SCREEN
Amphetamines, 500 ng/mL Cutoff: NEGATIVE
Barbiturates: NEGATIVE
Benzodiazepines: NEGATIVE
Buprenorphine: NEGATIVE
Cocaine Metabolite Screen, Urine: NEGATIVE
Fentanyl: NEGATIVE
METHADONE, 300 CUTOFF: NEGATIVE
OPIATES, 300 CUTOFF: NEGATIVE
Oxycodone: NEGATIVE
TCA, 1000 CUTOFF: NEGATIVE
THC, 50ng Cutoff: NEGATIVE

## 2019-07-26 LAB — URIC ACID
Uric Acid: 6.6 mg/dL (ref 3.8–8.7)
Uric Acid: 6.6 mg/dL (ref 3.8–8.7)

## 2019-07-26 LAB — ANTIBODY SCREEN
Antibody Screen: NEGATIVE
Antibody Screen: NEGATIVE

## 2019-07-26 LAB — PROTEIN / CREATININE RATIO, URINE
Creatinine, Ur: 119.46 mg/dL
Creatinine, Urine: 119.46 mg/dL
PROTEIN/CREAT RATIO URINE RAN: 0.55 ratio
Prot/Creat Ratio, Ur: 0.55 ratio
Protein, Urine, Random: 66 mg/dL
Total Protein, Ur: 66 mg/dL

## 2019-07-26 LAB — OB URINE DRUG SCREEN REFLEX TO CONFIRMATION
Amphetamine, 500 ng/mL Cutoff: NEGATIVE
Barbiturates UR, 300  ng/mL Cutoff: NEGATIVE
Benzodiazepines UR, 300 ng/mL Cutoff: NEGATIVE
Buprenorphine, 5 ng/mL Cutoff: NEGATIVE
Cocaine UR, 300 ng/mL Cutoff: NEGATIVE
Fentanyl, 2 ng/mL Cutoff: NEGATIVE
Methadone, UR, 300 ng/mL Cutoff: NEGATIVE
Opiates UR, 300 ng/mL Cutoff: NEGATIVE
Oxycodone, 100 ng/mL Cutoff: NEGATIVE
THC UR, 50 ng/mL Cutoff: NEGATIVE
Tricyclic Antidepressants, 300 ng/mL Cutoff: NEGATIVE

## 2019-07-26 MED ORDER — oxytocin (PITOCIN) 20 units in lactated ringers 500 mL IV BOLUS
20 | INTRAVENOUS | Status: AC | PRN
Start: 2019-07-26 — End: 2019-07-26
  Administered 2019-07-26: 18:00:00 20 mL/h via INTRAVENOUS

## 2019-07-26 MED ORDER — oxytocin (PITOCIN) 20 units in lactated ringers 500 mL IV BOLUS
20 | INTRAVENOUS | Status: AC | PRN
Start: 2019-07-26 — End: 2019-07-26

## 2019-07-26 MED ORDER — lactated Ringers 1,000 mL bolus
INTRAVENOUS | Status: AC | PRN
Start: 2019-07-26 — End: 2019-07-28

## 2019-07-26 MED ORDER — acetaminophen (TYLENOL EXTRA STRENGTH) 500 MG tablet
500 | ORAL_TABLET | Freq: Four times a day (QID) | ORAL | 0 refills | 8.00000 days | Status: AC | PRN
Start: 2019-07-26 — End: ?

## 2019-07-26 MED ORDER — diphenoxylate-atropine (LOMOTIL) 2.5-0.025 mg per tablet 1 tablet
2.5-0.025 | ORAL | Status: AC | PRN
Start: 2019-07-26 — End: 2019-07-26

## 2019-07-26 MED ORDER — oxyCODONE (ROXICODONE) immediate release tablet 10 mg
5 | ORAL | Status: AC | PRN
Start: 2019-07-26 — End: 2019-07-28

## 2019-07-26 MED ORDER — ketorolac (TORADOL) 30 mg/mL (1 mL) injection
30 | INTRAMUSCULAR | Status: AC
Start: 2019-07-26 — End: ?

## 2019-07-26 MED ORDER — lactated Ringers infusion
INTRAVENOUS | Status: AC
Start: 2019-07-26 — End: 2019-07-27
  Administered 2019-07-26: 22:00:00 25 mL/h via INTRAVENOUS

## 2019-07-26 MED ORDER — hydrOXYzine HCL (ATARAX) tablet 10 mg
10 | Freq: Once | ORAL | Status: AC | PRN
Start: 2019-07-26 — End: 2019-07-26

## 2019-07-26 MED ORDER — ketorolac (TORADOL) injection 30 mg
30 | INTRAMUSCULAR | Status: AC | PRN
Start: 2019-07-26 — End: 2019-07-26
  Administered 2019-07-26: 18:00:00 30 via INTRAVENOUS

## 2019-07-26 MED ORDER — acetaminophen (TYLENOL) suppository 975 mg
650 | Freq: Once | RECTAL | Status: AC
Start: 2019-07-26 — End: 2019-07-26

## 2019-07-26 MED ORDER — dexamethasone (PF) (DECADRON) 10 mg/mL injection Soln
10 | INTRAMUSCULAR | Status: AC
Start: 2019-07-26 — End: ?

## 2019-07-26 MED ORDER — diphenhydrAMINE (BENADRYL) injection 25 mg
50 | Freq: Four times a day (QID) | INTRAMUSCULAR | Status: AC | PRN
Start: 2019-07-26 — End: 2019-07-27

## 2019-07-26 MED ORDER — peppermint oil liquid 1 mL
Freq: Once | Status: AC | PRN
Start: 2019-07-26 — End: 2019-07-26

## 2019-07-26 MED ORDER — oxytocin (PITOCIN) injection 10 Units
10 | Freq: Once | INTRAMUSCULAR | Status: AC | PRN
Start: 2019-07-26 — End: 2019-07-26

## 2019-07-26 MED ORDER — fentaNYL (SUBLIMAZE) injection
50 | INTRAMUSCULAR | Status: AC | PRN
Start: 2019-07-26 — End: 2019-07-26
  Administered 2019-07-26: 18:00:00 15 via INTRATHECAL

## 2019-07-26 MED ORDER — lactated Ringers 1,000 mL bolus
INTRAVENOUS | Status: AC | PRN
Start: 2019-07-26 — End: 2019-07-26
  Administered 2019-07-26: 16:00:00 1000 mL via INTRAVENOUS

## 2019-07-26 MED ORDER — glycopyrrolate (ROBINUL) 0.2 mg/mL injection
0.2 | INTRAMUSCULAR | Status: AC
Start: 2019-07-26 — End: ?

## 2019-07-26 MED ORDER — ondansetron (ZOFRAN) injection
4 | INTRAMUSCULAR | Status: AC | PRN
Start: 2019-07-26 — End: 2019-07-26
  Administered 2019-07-26: 18:00:00 4 via INTRAVENOUS

## 2019-07-26 MED ORDER — methylergonovine (METHERGINE) injection 0.2 mg
0.2 | INTRAMUSCULAR | Status: AC | PRN
Start: 2019-07-26 — End: 2019-07-28

## 2019-07-26 MED ORDER — bupivacaine 0.75% in dextrose 8.25% (intrathecal) (MARCAINE) 0.75 % (7.5 mg/mL) injection
0.75 | INTRAMUSCULAR | Status: AC
Start: 2019-07-26 — End: ?

## 2019-07-26 MED ORDER — miSOPROStoL (CYTOTEC) tablet 800 mcg
200 | ORAL | Status: AC | PRN
Start: 2019-07-26 — End: 2019-07-28

## 2019-07-26 MED ORDER — oxytocin (PITOCIN) 20 units in lactated ringers 500 mL infusion
20 | Freq: Once | INTRAVENOUS | Status: AC | PRN
Start: 2019-07-26 — End: 2019-07-26
  Administered 2019-07-26: 19:00:00 5 [IU]/h via INTRAVENOUS

## 2019-07-26 MED ORDER — loratadine (CLARITIN) tablet 10 mg
10 | Freq: Every day | ORAL | Status: AC | PRN
Start: 2019-07-26 — End: 2019-07-27

## 2019-07-26 MED ORDER — hydrOXYzine HCL (ATARAX) tablet 50 mg
25 | Freq: Every evening | ORAL | Status: AC | PRN
Start: 2019-07-26 — End: 2019-07-28

## 2019-07-26 MED ORDER — ceFAZolinANCEFIVPB2ginD5Wduplex
2 | Freq: Once | INTRAVENOUS | Status: AC
Start: 2019-07-26 — End: 2019-07-26
  Administered 2019-07-26: 18:00:00 2 g via INTRAVENOUS

## 2019-07-26 MED ORDER — lactated Ringers infusion
INTRAVENOUS | Status: AC | PRN
Start: 2019-07-26 — End: 2019-07-26

## 2019-07-26 MED ORDER — ondansetron (ZOFRAN) 4 mg/2 mL injection
4 | INTRAMUSCULAR | Status: AC
Start: 2019-07-26 — End: ?

## 2019-07-26 MED ORDER — phenylephrine (NEO-SYNEPHRINE) injection
10 | INTRAMUSCULAR | Status: AC | PRN
Start: 2019-07-26 — End: 2019-07-26
  Administered 2019-07-26: 17:00:00 200 via INTRAVENOUS

## 2019-07-26 MED ORDER — oxytocin (PITOCIN) 20 units in lactated ringers 500 mL infusion
20 | Freq: Once | INTRAVENOUS | Status: AC | PRN
Start: 2019-07-26 — End: 2019-07-28

## 2019-07-26 MED ORDER — morphine (PF) injection
1 | INTRAMUSCULAR | Status: AC | PRN
Start: 2019-07-26 — End: 2019-07-26
  Administered 2019-07-26: 18:00:00 0.2 via INTRATHECAL

## 2019-07-26 MED ORDER — PNV,calcium 72-iron-folic acid (PRENATAL PLUS) 27 mg iron- 1 mg tablet Tab 1 tablet
27 | Freq: Every day | ORAL | Status: AC
Start: 2019-07-26 — End: 2019-07-28
  Administered 2019-07-26 – 2019-07-27 (×2): 1 via ORAL

## 2019-07-26 MED ORDER — acetaminophen (TYLENOL) suppository 975 mg
325 | Freq: Three times a day (TID) | RECTAL | Status: AC
Start: 2019-07-26 — End: 2019-07-28

## 2019-07-26 MED ORDER — morphine (PF) 1 mg/mL injection
1 | INTRAMUSCULAR | Status: AC
Start: 2019-07-26 — End: ?

## 2019-07-26 MED ORDER — miSOPROStoL (CYTOTEC) tablet 800 mcg
200 | ORAL | Status: AC | PRN
Start: 2019-07-26 — End: 2019-07-26

## 2019-07-26 MED ORDER — acetaminophen (TYLENOL) tablet 975 mg
325 | Freq: Once | ORAL | Status: AC
Start: 2019-07-26 — End: 2019-07-26
  Administered 2019-07-26: 16:00:00 975 mg via ORAL

## 2019-07-26 MED ORDER — calcium gluconate 100 mg/mL (10%) injection 1 g
100 | Freq: Once | INTRAVENOUS | Status: AC | PRN
Start: 2019-07-26 — End: 2019-07-26

## 2019-07-26 MED ORDER — diphenoxylate-atropine (LOMOTIL) 2.5-0.025 mg per tablet 1 tablet
2.5-0.025 | ORAL | Status: AC | PRN
Start: 2019-07-26 — End: 2019-07-28

## 2019-07-26 MED ORDER — oxytocin (PITOCIN) 20 units in lactated ringers 500 mL IV BOLUS
20 | INTRAVENOUS | Status: AC | PRN
Start: 2019-07-26 — End: 2019-07-28

## 2019-07-26 MED ORDER — carboprost (HEMABATE) injection 250 mcg
250 | INTRAMUSCULAR | Status: AC | PRN
Start: 2019-07-26 — End: 2019-07-26

## 2019-07-26 MED ORDER — citric acid-sodium citrate (BICITRA) solution 15 mL
500-334 | Freq: Once | ORAL | Status: AC
Start: 2019-07-26 — End: 2019-07-26
  Administered 2019-07-26: 16:00:00 15 mL via ORAL

## 2019-07-26 MED ORDER — lactated Ringers infusion
INTRAVENOUS | Status: AC
Start: 2019-07-26 — End: 2019-07-26
  Administered 2019-07-26 (×2): via INTRAVENOUS

## 2019-07-26 MED ORDER — bupivacaine (MARCAINE) injection
0.75 | INTRAMUSCULAR | Status: AC | PRN
Start: 2019-07-26 — End: 2019-07-26
  Administered 2019-07-26: 18:00:00 1.6 via INTRASPINAL

## 2019-07-26 MED ORDER — lidocaine (PF) 2% (20 mg/mL) Soln
20 | INTRAMUSCULAR | Status: AC | PRN
Start: 2019-07-26 — End: 2019-07-26
  Administered 2019-07-26: 17:00:00 3

## 2019-07-26 MED ORDER — methylergonovine (METHERGINE) injection 0.2 mg
0.2 | INTRAMUSCULAR | Status: AC | PRN
Start: 2019-07-26 — End: 2019-07-26

## 2019-07-26 MED ORDER — simethicone (MYLICON) chewable tablet 80 mg
80 | Freq: Four times a day (QID) | ORAL | Status: AC | PRN
Start: 2019-07-26 — End: 2019-07-28
  Administered 2019-07-27 – 2019-07-28 (×2): 80 mg via ORAL

## 2019-07-26 MED ORDER — fentaNYL (SUBLIMAZE) 50 mcg/mL injection
50 | INTRAMUSCULAR | Status: AC
Start: 2019-07-26 — End: ?

## 2019-07-26 MED ORDER — diphenhydrAMINE (BENADRYL) capsule 25 mg
25 | Freq: Four times a day (QID) | ORAL | Status: AC | PRN
Start: 2019-07-26 — End: 2019-07-28
  Administered 2019-07-27: 04:00:00 25 mg via ORAL

## 2019-07-26 MED ORDER — carboprost (HEMABATE) injection 250 mcg
250 | INTRAMUSCULAR | Status: AC | PRN
Start: 2019-07-26 — End: 2019-07-28

## 2019-07-26 MED ORDER — dexamethasone (DECADRON) injection
4 | INTRAMUSCULAR | Status: AC | PRN
Start: 2019-07-26 — End: 2019-07-26
  Administered 2019-07-26: 18:00:00 10 via INTRAVENOUS

## 2019-07-26 MED ORDER — magnesium sulfate 40 gram in sterile water 1000 mL iv infusion
40 | INTRAVENOUS | Status: AC
Start: 2019-07-26 — End: 2019-07-27
  Administered 2019-07-26: 23:00:00 2 g/h via INTRAVENOUS

## 2019-07-26 MED ORDER — dexamethasone (DECADRON) injection 4 mg
4 | Freq: Once | INTRAMUSCULAR | Status: AC | PRN
Start: 2019-07-26 — End: 2019-07-26

## 2019-07-26 MED ORDER — bisacodyL (DULCOLAX) suppository 10 mg
10 | Freq: Every day | RECTAL | Status: AC | PRN
Start: 2019-07-26 — End: 2019-07-28

## 2019-07-26 MED ORDER — tranexamic acid (CYCLOKAPRON) IV Push - for Post-Partum Hemorrhage 1,000 mg
1000 | Freq: Once | INTRAVENOUS | Status: AC | PRN
Start: 2019-07-26 — End: 2019-07-26

## 2019-07-26 MED ORDER — oxyCODONE (ROXICODONE) 5 MG immediate release tablet
5 | ORAL_TABLET | Freq: Four times a day (QID) | ORAL | 0 refills | 6.00000 days | Status: AC | PRN
Start: 2019-07-26 — End: 2019-07-31

## 2019-07-26 MED ORDER — proMETHazine (PHENERGAN) injection 6.25 mg
25 | Freq: Four times a day (QID) | INTRAMUSCULAR | Status: AC | PRN
Start: 2019-07-26 — End: 2019-07-28

## 2019-07-26 MED ORDER — acetaminophen (TYLENOL) tablet 975 mg
325 | Freq: Three times a day (TID) | ORAL | Status: AC
Start: 2019-07-26 — End: 2019-07-28
  Administered 2019-07-27 – 2019-07-28 (×4): 975 mg via ORAL

## 2019-07-26 MED ORDER — ibuprofen (MOTRIN) tablet 800 mg
800 | Freq: Three times a day (TID) | ORAL | Status: AC
Start: 2019-07-26 — End: 2019-07-28
  Administered 2019-07-27 (×3): 800 mg via ORAL

## 2019-07-26 MED ORDER — famotidine (PF) (PEPCID) injection 20 mg
20 | Freq: Once | INTRAVENOUS | Status: AC
Start: 2019-07-26 — End: 2019-07-26
  Administered 2019-07-26: 16:00:00 20 mg via INTRAVENOUS

## 2019-07-26 MED ORDER — ketorolac (TORADOL) injection 30 mg
30 | Freq: Three times a day (TID) | INTRAMUSCULAR | Status: AC
Start: 2019-07-26 — End: 2019-07-28

## 2019-07-26 MED ORDER — ibuprofen (MOTRIN) 600 MG tablet
600 | ORAL_TABLET | Freq: Four times a day (QID) | ORAL | 0 refills | Status: AC | PRN
Start: 2019-07-26 — End: ?

## 2019-07-26 MED ORDER — magnesium sulfate in sterile water 50 mL IVPB 2 g
2 | INTRAVENOUS | Status: AC
Start: 2019-07-26 — End: 2019-07-26
  Administered 2019-07-26 (×3): 2 g via INTRAVENOUS

## 2019-07-26 MED ORDER — docusate sodium (COLACE) capsule 100 mg
100 | Freq: Every day | ORAL | Status: AC
Start: 2019-07-26 — End: 2019-07-28
  Administered 2019-07-26 – 2019-07-27 (×2): 100 mg via ORAL

## 2019-07-26 MED ORDER — oxyCODONE (ROXICODONE) immediate release tablet 5 mg
5 | ORAL | Status: AC | PRN
Start: 2019-07-26 — End: 2019-07-28

## 2019-07-26 MED ORDER — phenylephrine (NEO-SYNEPHRINE) 10 mg in sodium chloride 0.9 % 100 mL infusion
10 | INTRAMUSCULAR | Status: AC | PRN
Start: 2019-07-26 — End: 2019-07-26
  Administered 2019-07-26: 17:00:00 50 via INTRAVENOUS

## 2019-07-26 MED ORDER — ondansetron (ZOFRAN) injection 4 mg
4 | Freq: Three times a day (TID) | INTRAMUSCULAR | Status: AC | PRN
Start: 2019-07-26 — End: 2019-07-28

## 2019-07-26 MED FILL — TYLENOL 325 MG TABLET: 325 325 mg | ORAL | Qty: 3

## 2019-07-26 MED FILL — OXYTOCIN 20 UNITS IN LACTATED RINGERS 500 ML IV BOLUS: 20 20 units/1000 mL | INTRAVENOUS | Qty: 500

## 2019-07-26 MED FILL — DIPHENHYDRAMINE 25 MG CAPSULE: 25 25 mg | ORAL | Qty: 1

## 2019-07-26 MED FILL — IBUPROFEN 800 MG TABLET: 800 800 MG | ORAL | Qty: 1

## 2019-07-26 MED FILL — FENTANYL (PF) 50 MCG/ML INJECTION SOLUTION: 50 50 mcg/mL | INTRAMUSCULAR | Qty: 2

## 2019-07-26 MED FILL — SODIUM CITRATE-CITRIC ACID 500 MG-334 MG/5 ML ORAL SOLUTION: 500-334 500-334 mg/5 mL | ORAL | Qty: 15

## 2019-07-26 MED FILL — LACTATED RINGERS INTRAVENOUS SOLUTION: 125.00 125.00 mL/hr | INTRAVENOUS | Qty: 1000

## 2019-07-26 MED FILL — FAMOTIDINE (PF) 20 MG/2 ML INTRAVENOUS SOLUTION: 20 20 mg/2 mL | INTRAVENOUS | Qty: 2

## 2019-07-26 MED FILL — BUPIVACAINE (PF) 0.75 % (7.5 MG/ML) IN 8.25 % DEXTROSE INJECTION: 0.75 0.75 % (7.5 mg/mL) | INTRAMUSCULAR | Qty: 2

## 2019-07-26 MED FILL — MAGNESIUM SULFATE 2 GRAM/50 ML (4 %) IN WATER INTRAVENOUS PIGGYBACK: 2 2 gram/50 mL (4 %) | INTRAVENOUS | Qty: 50

## 2019-07-26 MED FILL — DOK 100 MG CAPSULE: 100 100 mg | ORAL | Qty: 1

## 2019-07-26 MED FILL — DURAMORPH (PF) 1 MG/ML INJECTION SOLUTION: 1 1 mg/mL | INTRAMUSCULAR | Qty: 10

## 2019-07-26 MED FILL — DEXAMETHASONE SODIUM PHOSPHATE (PF) 10 MG/ML INJECTION SOLUTION: 10 10 mg/mL | INTRAMUSCULAR | Qty: 1

## 2019-07-26 MED FILL — MAGNESIUM SULFATE 40 GRAM/1,000 ML (4 %) IN WATER INTRAVENOUS SOLUTION: 40 40 gram/1,000 mL (4 %) | INTRAVENOUS | Qty: 1000

## 2019-07-26 MED FILL — KETOROLAC 30 MG/ML (1 ML) INJECTION SOLUTION: 30 30 mg/mL (1 mL) | INTRAMUSCULAR | Qty: 1

## 2019-07-26 MED FILL — GLYCOPYRROLATE 0.2 MG/ML INJECTION SOLUTION: 0.2 0.2 mg/mL | INTRAMUSCULAR | Qty: 2

## 2019-07-26 MED FILL — PRENATAL PLUS (CALCIUM CARBONATE) 27 MG IRON-1 MG TABLET: 27 27 mg iron- 1 mg | ORAL | Qty: 1

## 2019-07-26 MED FILL — ONDANSETRON HCL (PF) 4 MG/2 ML INJECTION SOLUTION: 4 4 mg/2 mL | INTRAMUSCULAR | Qty: 2

## 2019-07-26 MED FILL — CEFAZOLIN 2 GRAM/50 ML IN DEXTROSE (ISO-OSMOTIC) INTRAVENOUS PIGGYBACK: 2 2 gram/50 mL | INTRAVENOUS | Qty: 50

## 2019-07-26 NOTE — Plan of Care (Signed)
Problem: C-Section Recovery Care  Goal: Verbalizes understanding of post-op instructions  Outcome: Progressing  Goal: Manages discomfort  Description: Assess and monitor for signs and symptoms of discomfort.  Assess patient's pain level regularly and per hospital policy.  Administer medications as ordered. Support use of nonpharmacological methods to help control pain such as distraction, imagery, relaxation, and application of heat and cold.  Collaborate with interdisciplinary team and patient to determine appropriate pain management plan.  Outcome: Progressing  Goal: Dressing intact until removed with any drainage marked  Description: During Recovery  Outcome: Progressing  Goal: Patient vital signs are stable  Outcome: Progressing  Goal: Urine output is 30 mL/hour or more  Outcome: Progressing  Goal: Fundus firm at midline  Outcome: Progressing

## 2019-07-26 NOTE — Unmapped (Addendum)
OB Spinal      Reason for block: primary anesthetic  Diagnosis:  cesarean section.      Patient location during procedure: OR  Performed intraoperatively.  Pre-procedure Pain Score:  0-No pain              Patient position: sitting    Preanesthetic Checklist  Completed: patient identified, anesthesia consent given, pre-op evaluation, timeout performed, IV checked, risks and benefits discussed, monitors and equipment checked/attached to patient, patient being monitored and hand hygiene performed  Anesthesia risks / alternatives discussed pre-op  Questions answered / anesthesia plan accepted and patient agrees to proceed..  Timeout performed at:  12:22. By:  T. Deuce Paternoster CRNA  Timeout verification:  correct patient, correct procedure, correct site and allergies reviewed.    Spinal    Prep: ChloraPrep and site prepped and draped                Approach: midline    Start time: 07/26/2019 12:24 PM    Needle     Needle type: Pencan   Introducer used.    Needle gauge: 25 G    Needle length: 3.5 in  Level of placement:  L3-4      Number of attempts:  1                Medications:    Injection time:  07/26/2019 12:30 PM                      Assessment  Events:  blood not aspirated via needle, injection not painful, no injection resistance, no paresthesia with needle placement, no other event and no positive intravascular test dose                            Block Effect:  No Unexpected/Untoward Events and Successful    Sensory level right: T3.  Sensory Level, Left:  T4  Post-procedure Pain Score:  0-No pain      End time: 07/26/2019 12:32 PM                  Staffing  Performed: RNSA   Resident/CRNA: Cristy Friedlander, CRNA  Other anesthesia staff: Marylen Ponto, RN    Medication administered at:  07/26/2019 12:30 PM

## 2019-07-26 NOTE — Unmapped (Signed)
Moved back to room 322 from SCN via bed.

## 2019-07-26 NOTE — Unmapped (Signed)
Anesthesia Transfer of Care Note    Patient: Kaitlyn Smith  Procedure(s) Performed: Procedure(s):  CESAREAN SECTION    Patient location: Labor and Delivery    Anesthesia type: spinal    Airway Device on Arrival to PACU/ICU: Room Air    IV Access: Peripheral    Monitors Recommended to be Used During PACU/ICU: Standard Monitors    Outstanding Issues to Address: None    Level of Consciousness: awake, alert  and oriented    Post vital signs:    Vitals:    07/26/19 1330   BP: 117/66   Pulse: 87   Resp: 16   Temp: 97.0  SpO2 100%       Complications: None    Date 07/25/19 0700 - 07/26/19 0659 07/26/19 0700 - 07/27/19 0659   Shift 0700-1459 1500-2259 2300-0659 24 Hour Total 0700-1459 1500-2259 2300-0659 24 Hour Total   INTAKE   I.V.     600(5.3)   600(5.3)     Volume (mL) (lactated Ringers infusion)     600   600   Shift Total(mL/kg)     600(5.3)   600(5.3)   OUTPUT   Blood     804   804     Calculated QBL (ml)     804   804   Shift Total(mL/kg)     804(7.1)   804(7.1)   Weight (kg)  113.4 113.4 113.4 113.4 113.4 113.4 113.4

## 2019-07-26 NOTE — Unmapped (Signed)
Anesthesia Post Note    Patient: Kaitlyn Smith    Procedure(s) Performed: Procedure(s):  CESAREAN SECTION    Anesthesia type: spinal    Patient location: Labor and Delivery    Post pain: Adequate analgesia    Post assessment: no apparent anesthetic complications, tolerated procedure well and able to flex hips and bend knees bilateral    Last Vitals:   Vitals:    07/26/19 1430 07/26/19 1445 07/26/19 1500 07/26/19 1515   BP: 122/69 115/65 125/75 128/86   BP Location:       Pulse: 72 67 83 77   Resp: 13 11 23 25    Temp:       TempSrc:       SpO2: 98% 99% 98% 98%   Weight:       Height:            Post vital signs: stable    Level of consciousness: awake, alert  and oriented    Complications: None

## 2019-07-26 NOTE — H&P (Signed)
Department of Obstetrics and Gynecology   Obstetrics History and Physical           CHIEF COMPLAINT:  Increased blood pressure, headache, swelling     HISTORY OF PRESENT ILLNESS:      The patient is a 29 y.o. female at [redacted]w[redacted]d.  OB History     Gravida   2    Para   1    Term   1    Preterm        AB        Living   1       SAB        TAB        Ectopic        Multiple   0    Live Births   1             Patient presents with a chief complaint as above and is being admitted for Pre-eclampsia. The patient was send from the office for severe range blood pressures and had severe range on arrival that have since been in the mild range. She reports the presence of a headache and significant increase in swelling. She is scheduled for a repeat cesarean section on 08/19/2019.    Estimated Due Date: Estimated Date of Delivery: 08/26/19     PRENATAL CARE:     Complicated by: none and preeclampsia/eclampsia     PAST OB HISTORY:  OB History     Gravida   2    Para   1    Term   1    Preterm        AB        Living   1       SAB        TAB        Ectopic        Multiple   0    Live Births   1                  Past Medical History:   Past Medical History:   Diagnosis Date    Hypertension     PIH       Past Surgical History:   Past Surgical History:   Procedure Laterality Date    CESAREAN SECTION      CESAREAN SECTION, CLASSIC N/A 12/14/2016    Procedure: CESAREAN SECTION;  Surgeon: Lavone Neri, MD;  Location: [facility] OB OR;  Service: Gynecology;  Laterality: N/A;    HIP SURGERY Bilateral     HIP SURGERY Bilateral 2013       Allergies:  Patient has no known allergies.     Social History:    Social History     Socioeconomic History    Marital status: Married     Spouse name: Not on file    Number of children: Not on file    Years of education: Not on file    Highest education level: Not on file   Occupational History    Not on file   Social Needs    Financial resource strain: Not on file    Food insecurity     Worry: Not on file      Inability: Not on file    Transportation needs     Medical: Not on file     Non-medical: Not on file   Tobacco Use    Smoking status: Never Smoker    Smokeless tobacco:  Never Used   Substance and Sexual Activity    Alcohol use: No    Drug use: No    Sexual activity: Yes     Partners: Male   Lifestyle    Physical activity     Days per week: Not on file     Minutes per session: Not on file    Stress: Not on file   Relationships    Social connections     Talks on phone: Not on file     Gets together: Not on file     Attends religious service: Not on file     Active member of club or organization: Not on file     Attends meetings of clubs or organizations: Not on file     Relationship status: Not on file    Intimate partner violence     Fear of current or ex partner: Not on file     Emotionally abused: Not on file     Physically abused: Not on file     Forced sexual activity: Not on file   Other Topics Concern    Not on file   Social History Narrative    Not on file       Family History:  No family history on file.     Medications Prior to Admission:  Medications Prior to Admission   Medication Sig Dispense Refill Last Dose    acetaminophen (ACETAMINOPHEN EXTRA STRENGTH) 500 MG tablet Take 2 tablets (1,000 mg total) by mouth every 6 hours as needed. 60 tablet 0 Past Week at Unknown time    albuterol (PROVENTIL HFA) 90 mcg/actuation Inhl inhaler Inhale 2 puffs into the lungs every 4 hours as needed for Wheezing or Shortness of Breath. 1 Inhaler 0 Past Month at Unknown time    PRENAT 115-IRON FUM-FOLIC-DSS ORAL Take by mouth.   07/24/2019 at Unknown time    ferrous sulfate 325 (65 FE) MG tablet Take 1 tablet (325 mg total) by mouth daily with breakfast. Indications: Iron Deficiency Anemia 30 tablet 0     ibuprofen (ADVIL,MOTRIN) 800 MG tablet Take 1 tablet (800 mg total) by mouth every 8 hours as needed for Pain. 30 tablet 0 More than a month at Unknown time    polyethylene glycol (MIRALAX) 17 gram  packet Take 17 g by mouth daily.           REVIEW OF SYSTEMS:     + swelling, headache, blurry vision     PHYSICAL EXAM:  Vitals:    07/25/19 2044 07/25/19 2255 07/26/19 0301 07/26/19 0548   BP: 145/90 141/84 (!) 142/96 (!) 150/96   Pulse: 89 95 96 88   Resp: 18 18 18 18    Temp: 97.7 F (36.5 C) 97.7 F (36.5 C) 98.1 F (36.7 C) 97.7 F (36.5 C)   TempSrc: Oral Oral Oral Oral   Weight:       Height:         General appearance:  awake, alert, cooperative, no apparent distress, and appears stated age  Neurologic:  Awake, alert, oriented to name, place and time.   Lungs:  No increased work of breathing, good air exchange  Abdomen:  Soft, non tender, gravid, consistent with her gestational age, EFW by Leopald's manouever was 3020 g  Fetal heart rate:  Reassuring.  Pelvis:  Adequate pelvis  Cervix:  Contraction frequency:      Membranes:  intact     Labs:     Lab  Results   Component Value Date    URICACID 6.6 07/26/2019     Lab Results   Component Value Date    GLUCOSE 84 07/26/2019    BUN 14 07/26/2019    CO2 25 07/26/2019    CREATININE 0.67 07/26/2019    K 3.9 07/26/2019    NA 138 07/26/2019    CL 105 07/26/2019    CALCIUM 9.2 07/26/2019    ALBUMIN 3.5 07/26/2019    PROT 6.3 (L) 07/26/2019    ALKPHOS 79 07/26/2019    ALT 12 07/26/2019    AST 14 07/26/2019    BILITOT 0.3 07/26/2019     Lab Results   Component Value Date    WBC 9.6 07/26/2019    HGB 13.4 07/26/2019    HCT 38.0 07/26/2019    MCV 85.2 07/26/2019    PLT 173 07/26/2019     Lab Results   Component Value Date    KETONESU Negative 07/25/2019    BLOODU Negative 07/25/2019    BILIRUBINUR Negative 07/25/2019    UROBILINOGEN <2.0 07/25/2019    PROTEINUA 100 (A) 07/25/2019    NITRITE Negative 07/25/2019    LEUKOCYTESUR Negative 07/25/2019    PHUR 6.0 07/25/2019    LABSPEC 1.014 07/25/2019    CLARITYU Slightly-Cloudy (A) 07/25/2019    COLORU Yellow 07/25/2019          ASSESSMENT AND PLAN:     29 yo G2 P1001 @ [redacted]w[redacted]d  1. Preeclampsia - severe range blood pressures  with symptoms. Discussed current clinical senario with the patient and that with severe range pressures over the gestational age of [redacted] weeks delivery is indicated. PC ratio significantly increased on admission and still elevated. No sign of HELPP syndrome from labs. Discussed the possibility of antenatal steroids and delaying delivery for 48 hours. All questions answered and information reviewed in detail. Patient and husband will discuss and we will discuss again prior to making a decision. I have recommended delivery prior to going home whether it be today or after receiving antenatal steroids. I also discussed the challenges of prematurity for the fetus and transitioning after delivery.  2. FHT reassuring  3. GBS: unknown

## 2019-07-26 NOTE — Unmapped (Signed)
[facility]  DEPARTMENT OF ANESTHESIOLOGY  PRE-PROCEDURAL EVALUATION    Kaitlyn Smith is a 29 y.o. year old female presenting for:    Procedure(s):  CESAREAN SECTION    Surgeon:   Tommy Medal, MD    Chief Complaint     Pregnancy    Review of Systems     Anesthesia Evaluation    Patient summary reviewed.       No history of anesthetic complications         Cardiovascular:    Hypertension is.    (-) past MI, dysrhythmias.    Neuro/Muscoloskeletal/Psych:    (+) back problem (pt states she threw out her back a couple weeks ago, which is since resolved after a visit with her chiropractor).    (-) CVA, headaches.     Pulmonary:    (+) asthma (sports induced. no recent inhaler use).  Recent URI (COVID positive a few weeks ago).        GI/Hepatic/Renal:      (-) GERD, liver disease, renal disease.    Endo/Other:        (-) diabetes mellitus, hypothyroidism, no bleeding disorder, no cancer, no clotting disorder.       Past Medical History     Past Medical History:   Diagnosis Date   ??? Hypertension     PIH       Past Surgical History     Past Surgical History:   Procedure Laterality Date   ??? CESAREAN SECTION     ??? CESAREAN SECTION, CLASSIC N/A 12/14/2016    Procedure: CESAREAN SECTION;  Surgeon: Lavone Neri, MD;  Location: [facility] OB OR;  Service: Gynecology;  Laterality: N/A;   ??? HIP SURGERY Bilateral    ??? HIP SURGERY Bilateral 2013       Family History     No family history on file.    Social History     Social History     Socioeconomic History   ??? Marital status: Married     Spouse name: Not on file   ??? Number of children: Not on file   ??? Years of education: Not on file   ??? Highest education level: Not on file   Occupational History   ??? Not on file   Social Needs   ??? Financial resource strain: Not on file   ??? Food insecurity     Worry: Not on file     Inability: Not on file   ??? Transportation needs     Medical: Not on file     Non-medical: Not on file   Tobacco Use   ??? Smoking status: Never Smoker   ??? Smokeless tobacco: Never  Used   Substance and Sexual Activity   ??? Alcohol use: No   ??? Drug use: No   ??? Sexual activity: Yes     Partners: Male   Lifestyle   ??? Physical activity     Days per week: Not on file     Minutes per session: Not on file   ??? Stress: Not on file   Relationships   ??? Social Wellsite geologist on phone: Not on file     Gets together: Not on file     Attends religious service: Not on file     Active member of club or organization: Not on file     Attends meetings of clubs or organizations: Not on file     Relationship status: Not on  file   ??? Intimate partner violence     Fear of current or ex partner: Not on file     Emotionally abused: Not on file     Physically abused: Not on file     Forced sexual activity: Not on file   Other Topics Concern   ??? Not on file   Social History Narrative   ??? Not on file       Medications     Allergies:  No Known Allergies    Home Meds:  Prior to Admission medications as of 07/25/19 1842   Medication Sig Taking?   acetaminophen (ACETAMINOPHEN EXTRA STRENGTH) 500 MG tablet Take 2 tablets (1,000 mg total) by mouth every 6 hours as needed. Yes   albuterol (PROVENTIL HFA) 90 mcg/actuation Inhl inhaler Inhale 2 puffs into the lungs every 4 hours as needed for Wheezing or Shortness of Breath. Yes   PRENAT 115-IRON FUM-FOLIC-DSS ORAL Take by mouth. Yes   ferrous sulfate 325 (65 FE) MG tablet Take 1 tablet (325 mg total) by mouth daily with breakfast. Indications: Iron Deficiency Anemia    ibuprofen (ADVIL,MOTRIN) 800 MG tablet Take 1 tablet (800 mg total) by mouth every 8 hours as needed for Pain.    polyethylene glycol (MIRALAX) 17 gram packet Take 17 g by mouth daily.        Inpatient Meds:  Scheduled:   ??? acetaminophen  975 mg Oral Once    Or   ??? acetaminophen  975 mg Rectal Once   ??? ceFAZolin (ANCEF) IVPB  2 g Intravenous Once   ??? citric acid-sodium citrate  15 mL Oral Once   ??? famotidine (PF)  20 mg Intravenous Once     Continuous:   ??? bolus IV fluid     ??? lactated Ringers     ??? lactated  Ringers         PRN: carboprost, diphenoxylate-atropine, bolus IV fluid, lactated Ringers, methylergonovine, miSOPROStoL, oxytocin (PITOCIN) 20 units in lactated ringers 500 mL, oxytocin (PITOCIN) 20 units in lactated ringers 500 mL, oxytocin (PITOCIN) 20 units in lactated ringers 500 mL, oxytocin, tranexamic acid    Vital Signs     Wt Readings from Last 3 Encounters:   07/25/19 (!) 250 lb (113.4 kg)   07/08/19 (!) 250 lb (113.4 kg)   01/11/19 (!) 226 lb 12.8 oz (102.9 kg)     Ht Readings from Last 3 Encounters:   07/25/19 5' 6 (1.676 m)   07/08/19 5' 6 (1.676 m)   01/11/19 5' 6 (1.676 m)     Temp Readings from Last 3 Encounters:   07/26/19 97.8 ??F (36.6 ??C) (Oral)   07/12/19 98.5 ??F (36.9 ??C) (Oral)   07/08/19 98.1 ??F (36.7 ??C) (Oral)     BP Readings from Last 3 Encounters:   07/26/19 150/88   07/12/19 130/87   07/08/19 (!) 136/98     Pulse Readings from Last 3 Encounters:   07/26/19 85   07/12/19 77   07/08/19 85     @LASTSAO2 (3)@    Physical Exam     Airway:     Mallampati: II  Mouth Opening: >2 FB  TM distance: > = 3 FB  Neck ROM: full  (-) no facial hair      Dental:   - No obvious cracked, loose, chipped, or missing teeth.     Pulmonary:       Breath sounds clear to auscultation.       Cardiovascular:  Rhythm: regular  Rate: normal    Neuro/Musculoskeletal/Psych:    Mental status: alert and oriented to person, place and time.          Abdominal:     Not obese.    Current OB Status:       Other Findings:        Laboratory Data     Lab Results   Component Value Date    WBC 9.6 07/26/2019    HGB 13.4 07/26/2019    HCT 38.0 07/26/2019    MCV 85.2 07/26/2019    PLT 173 07/26/2019       No results found for: [facility]    Lab Results   Component Value Date    GLUCOSE 84 07/26/2019    BUN 14 07/26/2019    CO2 25 07/26/2019    CREATININE 0.67 07/26/2019    K 3.9 07/26/2019    NA 138 07/26/2019    CL 105 07/26/2019    CALCIUM 9.2 07/26/2019    ALBUMIN 3.5 07/26/2019    PROT 6.3 (L) 07/26/2019    ALKPHOS 79 07/26/2019     ALT 12 07/26/2019    AST 14 07/26/2019    BILITOT 0.3 07/26/2019       Lab Results   Component Value Date    INR 0.9 07/08/2019       Lab Results   Component Value Date    PREGTESTUR POSITIVE (A) 04/25/2016    HCGQUANT 17,057.00 04/25/2016       Anesthesia Plan     ASA 2         Female and current non-smoker    Anesthesia Type:  spinal.      PONV Risk Factors: female, current non-smoker,                    Anesthetic plan and risks discussed with patient.    Plan, alternatives, and risks of anesthesia, including death, have been explained to and discussed with the patient/legal guardian.  By my assessment, the patient/legal guardian understands and agrees.  Scenario presented in detail.  Questions answered.    Blood products not discussed.      Plan discussed with attending and RNSA.

## 2019-07-26 NOTE — Unmapped (Signed)
Delivery Information for Kaitlyn Smith    Labor Events:    Preterm labor:     Labor onset date:     Labor onset time:     Antenatal steroids:     Rupture date: 07/26/2019   Rupture time: 12:48 PM   Rupture type: Artificial   Fluid Color: Clear   Dilation complete date:     Dilation complete time:     Induction:     Induction indication:     Augmentation:     Pushing Started:     Labor Complications     Additional Complications:     Cervical ripening:            Antibiotics received during labor?         Labor Length:    First Stage:   hours,   minutes    Second Stage:   hours,   minutes   Third Stage:   hours,   minutes       Delivery:    Episiotomy:         Laceration Repaired?   Pitocin started within 60 seconds of delivery:     Perineal:       Periurethral:       Labial:       Sulcus:       Vaginal:       Cervical:           Repair suture:     Repair # of packets:     Blood loss (ml):     Counts          Forceps attempted?     Vacuum extractor attempted     Indications:     Forceps application location:         Asst Del Details (If applicable): No data filed           Presentation/Position:  ;            Anesthesia:    Method:      Analgesics:           Other Procedures:      Birth information:  Date of birth: 07/26/2017   Time of birth: 12:50 PM   Sex: female   Delivery type:     Breech type (if applicable):     Observed anomalies/comments       Gestational Age: [redacted]w[redacted]d      Living? Living          APGARS  One minute Five minutes Ten minutes Fifteen minutes Twenty minutes   Skin color: 1   1             Heart rate: 2   2             Grimace: 2   2              Muscle tone: 2   2              Breathing: 1   2              Totals: 8   9                  Shoulder Dystocia:   Shoulder Dystocia    No data filed            Newborn Measurements:  Weight: 2640 g (5 lb 13.1 oz)    Length: 48.3 cm (19)    Head  circumference (in): 13.5    Chest circumference (in):     Observed anomalies:     Date of Fetal Demise:   Weight:  93.1 oz Length: 19   Head circumference: 0.343 m           Cord Information:       Disposition of cord blood:      Blood gases sent?    Complications:     Cord clamped date/time:      Resuscitation:       Placenta:  Delivered:    Appearance:    Removal:      Disposition:         Delivery Providers:    Delivery Clinician:      Other Providers:           Obstetrical Discharge Form        Gestational Age:[redacted]w[redacted]d     Antepartum complications: preeclampsia     Date of Delivery: 07/26/2019      Type of Delivery: c-section for previous cesarean     Delivered By:                Information for the patient's newborn:  Carolyna, Snoddy [[id number]]   @APGAR1 @        Information for the patient's newborn:  Myrtie, Caracciolo [[id number]]   @APGAR5 @        Information for the patient's newborn:  Ayrian, Carrisalez [[id number]]   2640 g (5 lb 13.1 oz)     Baby:   Information for the patient's newborn:  Talaysha, Rayle [[id number]]   Birth Weight: 2640 g (5 lb 13.1 oz)       Anesthesia: spinal     Intrapartum complications: None     Postpartum complications: preeclampsia/eclampsia     Discharge Medication:    Deri, Deguire   Home Medication Instructions [id number]    Printed on:07/26/19 1329   Medication Information                      acetaminophen (TYLENOL EXTRA STRENGTH) 500 MG tablet  Take 1 tablet (500 mg total) by mouth every 6 hours as needed.             albuterol (PROVENTIL HFA) 90 mcg/actuation Inhl inhaler  Inhale 2 puffs into the lungs every 4 hours as needed for Wheezing or Shortness of Breath.             ferrous sulfate 325 (65 FE) MG tablet  Take 1 tablet (325 mg total) by mouth daily with breakfast. Indications: Iron Deficiency Anemia             ibuprofen (MOTRIN) 600 MG tablet  Take 1 tablet (600 mg total) by mouth every 6 hours as needed for Pain.             oxyCODONE (ROXICODONE) 5 MG immediate release tablet  Take 1 tablet (5 mg total) by mouth every 6 hours as needed for Pain for up to 5 days.              polyethylene glycol (MIRALAX) 17 gram packet  Take 17 g by mouth daily.             PRENAT 115-IRON FUM-FOLIC-DSS ORAL  Take by mouth.                  Discharge Date: 07/27/19    Discharged home in stable condition.     Plan:  Follow up in 6 week(s)

## 2019-07-26 NOTE — Unmapped (Signed)
C-Section Delivery    RT called to OR  for a c-section. Infant brought to warmer at approximately 1 minute of life. Infant with good tone, good respiratory effort. Infant warmed, dried and stimulated. Infant was without signs/symptoms of respiratory distress. Present for 1 and 5 minute APGAR scores. Infant care to RN.

## 2019-07-26 NOTE — Nursing Note (Signed)
Went to SCN to see infant via bed. Will continue to monitor patient while visiting infant in SCN.

## 2019-07-26 NOTE — Unmapped (Signed)
Cesarean Section Procedure Note    Indications: previous uterine incision kronig    Pre-operative Diagnosis: 35 week 4 day pregnancy.    Post-operative Diagnosis: same    Surgeon: Tommy Medal     Assistants:     Anesthesia: Spinal anesthesia    ASA Class:     Procedure Details   The patient was seen in the Holding Room. The risks, benefits, complications, treatment options, and expected outcomes were discussed with the patient.  The patient concurred with the proposed plan, giving informed consent.  The site of surgery properly noted/marked. The patient was taken to Operating Room # 2, identified as Kaitlyn Smith and the procedure verified as C-Section Delivery. A Time Out was held and the above information confirmed.    After induction of anesthesia, the patient was draped and prepped in the usual sterile manner. A Pfannenstiel incision was made and carried down through the subcutaneous tissue to the fascia. Fascial incision was made and extended transversely using traction. The fascia was separated from the underlying rectus tissue superiorly and inferiorly. The peritoneum was identified and entered bluntly. The utero-vesical peritoneal reflection was identified and a low transverse uterine incision was made. Delivered from VERTEX presentation was a 2640 gram FEMALE with Apgar scores of 8 at one minute and 9 at five minutes. After the umbilical cord was clamped and cut cord blood was obtained for evaluation. The placenta was removed intact and appeared normal. The uterine outline, tubes and ovaries appeared normal. The uterine incision was closed with running sutures of 0 Vicryl and a second layer of 0 Monocryl suture. Hemostasis was observed. Lavage was carried out until clear. The peritoneal and rectus layers were closed together with a running 3-0 Vicryl suture.The fascia was then reapproximated with running sutures of 0 Vicryl. The Sub-Q was closed with a running 3-0 vicryl suture that was run back in the  subdermal layer.The skin was reapproximated with 4-0 Vicryl suture.    Instrument, sponge, and needle counts were correct prior the abdominal closure and at the conclusion of the case.     Findings:  Normal uterus, tubes and ovaries    Estimated Blood Loss:  500 ml           Drains: none           Total IV Fluids: ml           Specimens: none           Implants: none           Complications:  None; patient tolerated the procedure well.           Disposition: PACU - hemodynamically stable.           Condition: stable    Attending Attestation: I performed the procedure.

## 2019-07-26 NOTE — Unmapped (Signed)
Problem: Alteration in maternal BP  Goal: Maintain acceptable BP  Outcome: Progressing

## 2019-07-26 NOTE — Unmapped (Signed)
Problem: Alteration in maternal BP  Goal: Maintain acceptable BP  07/26/2019 1933 by Lindaann Slough, RN  Outcome: Progressing  07/26/2019 1015 by Lindaann Slough, RN  Outcome: Progressing     Problem: Acute Pain  Description: Patient's pain progressing toward patient's stated pain goal  Goal: Patient displays improved well-being such as baseline levels for pulse, BP, respirations and relaxed muscle tone or body posture  Outcome: Progressing  Goal: Patient will manage pain with the appropriate technique/intervention  Description: Assess and monitor patient's pain using appropriate pain scale. Collaborate with interdisciplinary team and initiate plan and interventions as ordered.  Re-assess patient's pain level 30-60 minutes after pain management intervention.  Outcome: Progressing  Goal: Patient will reduce or eliminate use of analgesics  Outcome: Progressing  Goal: Patients pain is managed to allow active participation in daily activities  Outcome: Progressing  Goal: Patient verbalizes a reduction in pain level  Outcome: Progressing  Goal: Discharge Pain Management Plan (Acute Pain)  Outcome: Progressing

## 2019-07-26 NOTE — Progress Notes (Signed)
Late entry from 09:30    Long discussion with the patient and her husband. All questions answered at length. Consent for cesarean section obtained. Risks reviewed and will plan to proceed with cesarean delivery as soon as labs are back and the OR is ready.

## 2019-07-26 NOTE — Unmapped (Signed)
This note was copied from a baby's chart.  1350  Mother seen briefly to assist with latch.  Infant not interested in latching with occasional audible grunting noted.  He was placed skin to skin and encouraged mother to try feeding again later.  Symphony breastpump in room with kit.  Encouraged mother to begin pumping within 6 hours of life if unable to latch infant.  Stated Rns could assist with pump session or future latch attempts.  Lanolin provided with instructions on use.  Vincie Linn Recardo Evangelist, APRN, IBCLC

## 2019-07-27 LAB — CBC
Hematocrit: 30.8 % (ref 35.0–45.0)
Hemoglobin: 10.8 g/dL (ref 11.7–15.5)
MCH: 30.4 pg (ref 27.0–33.0)
MCHC: 35.1 g/dL (ref 32.0–36.0)
MCV: 86.7 fL (ref 80.0–100.0)
MPV: 8.7 fL (ref 7.5–11.5)
Platelets: 184 10*3/uL (ref 140–400)
RBC: 3.55 10*6/uL (ref 3.80–5.10)
RDW: 13.7 % (ref 11.0–15.0)
WBC: 15.1 10*3/uL (ref 3.8–10.8)

## 2019-07-27 MED FILL — TYLENOL 325 MG TABLET: 325 325 mg | ORAL | Qty: 3

## 2019-07-27 MED FILL — PRENATAL PLUS (CALCIUM CARBONATE) 27 MG IRON-1 MG TABLET: 27 27 mg iron- 1 mg | ORAL | Qty: 1

## 2019-07-27 MED FILL — IBUPROFEN 800 MG TABLET: 800 800 MG | ORAL | Qty: 1

## 2019-07-27 MED FILL — DOK 100 MG CAPSULE: 100 100 mg | ORAL | Qty: 1

## 2019-07-27 MED FILL — SIMETHICONE 80 MG CHEWABLE TABLET: 80 80 MG | ORAL | Qty: 1

## 2019-07-27 NOTE — Nursing Note (Signed)
Infant being sent out of hospital for more advanced care, Mother requests discharge so she may accompany infant/visit.  Call to Dr. Margo Aye, reviewed Pt's status, current vital signs, previous 1800 BP, new bilateral pedal edema, otherwise Pt assessment normal, denies PIH symptoms. Order received for discharge, return to office Monday for BP check.

## 2019-07-27 NOTE — Unmapped (Signed)
Problem: Alteration in maternal BP  Goal: Maintain acceptable BP  Outcome: Progressing     Problem: Acute Pain  Description: Patient's pain progressing toward patient's stated pain goal  Goal: Patient displays improved well-being such as baseline levels for pulse, BP, respirations and relaxed muscle tone or body posture  Outcome: Progressing  Goal: Patient will manage pain with the appropriate technique/intervention  Description: Assess and monitor patient's pain using appropriate pain scale. Collaborate with interdisciplinary team and initiate plan and interventions as ordered.  Re-assess patient's pain level 30-60 minutes after pain management intervention.  Outcome: Progressing  Goal: Patient will reduce or eliminate use of analgesics  Outcome: Progressing  Goal: Patients pain is managed to allow active participation in daily activities  Outcome: Progressing  Goal: Patient verbalizes a reduction in pain level  Outcome: Progressing  Goal: Discharge Pain Management Plan (Acute Pain)  Outcome: Progressing     Problem: Hemodynamic Status  Goal: Patient's vitals signs are stable  Description: Assess and monitor patient's heart rate, rhythm, respiratory rate, peripheral pulses, capillary refill, color, body temperature, intake and output, labs and physical activity tolerance.   Observe for signs of chest pain (note location, duration, severity, radiation and associated symptoms such as diaphoresis, nausea, indigestion).  Monitor for signs and symptoms of heart failure (eg. shortness of breath, edema of feet/ankles/legs, rapid irregular heart rate, coughing, wheezing, white/pink blood tinged sputum, sudden weight gain, chest pain). Collaborate with interdisciplinary team and initiate plan and interventions as ordered.  Outcome: Progressing     Problem: Safety  Goal: Free from accidental physical injury  Outcome: Progressing

## 2019-07-27 NOTE — Unmapped (Signed)
This note was copied from a baby's chart.  1610    SCN LACTATION CONSULTATION    Follow-up Lactation Consult for Mother with Infant in SCN      Date of birth: 07/26/2017    Time of Birth:Time of Birth: 12:50 PM    Gestational age: Gestational Age: [redacted]w[redacted]d Birth Weight: 2640 g (5 lb 13.1 oz)     Assisted mother with pump session.  Infant was born @ 55 + weeks had RDS requiring admission to SCN with CPAP.   Maternal Assessment:     Maternal Data:29 year old G2 P1 who delivered via C/S due to pre-eclampsia wit yesterday.     Goal after education: directly breastfeed     Breastfeeding history: sibling was tongue tied, struggled with latch and mother exclusively pumped for 6 months.     Breast Assessment:  Breasts are: large  Nipples: everts well   Are[place]: WDL  Nipple comfort: no concerns   Nipple Integrity: intact    Maternal Risk Factors for Low Supply:separation from infant    Infant Assessment:    DOL:1  Feeding: Feeding Type: NPO    Percent weight change from birthweight: 0%       Pumping Assessment     Pumping: 4-6 times/24 hours,   Pumping at home/overnight: every 3-4 hours  Flange size: was using a 27 flange.  Discussed proper fit and with flange down sized to 24 mm.  Supply: 9 ml 1st pump session, then 2 ml, then 0    Intervention during consultation:   Interventions performed: electric breast pump, warm compress, hand expression, lanolin provided and instructed and education  Manual Expression: MOB states she understands  Bedside breast pump: Electric breast pump, set up and instructed, assisted with pump session and cleaning of pump kit parts after each use with sterilizing once/day.  Amount of milk expressed: 9 ml 1st session then less.  Current pump session, drops obtained.  Pump arrangements: mother has a pump for home use.    Education: destressing techniques: pumping at infant's bedside, viewing camera during pump session, deep breathing exercises, not staring at bottles during session, destraction by talking  to dad, watching tv.       Warm packs, labels, syringes, additional soap and lanolin provided.    Plan:     Recommend: Pump 8 or more times in 24 hours, clean pump parts after each use, use miccrowave steam bag daily and pump at baby's bedside    Mother's response: active in care   Riely Oetken J Irl Bodie, APRN, IBCLC

## 2019-07-27 NOTE — Unmapped (Signed)
Problem: Alteration in maternal BP  Goal: Maintain acceptable BP  Outcome: Completed     Problem: Acute Pain  Description: Patient's pain progressing toward patient's stated pain goal  Goal: Patient displays improved well-being such as baseline levels for pulse, BP, respirations and relaxed muscle tone or body posture  Outcome: Completed  Goal: Patient will manage pain with the appropriate technique/intervention  Description: Assess and monitor patient's pain using appropriate pain scale. Collaborate with interdisciplinary team and initiate plan and interventions as ordered.  Re-assess patient's pain level 30-60 minutes after pain management intervention.  Outcome: Completed  Goal: Patient will reduce or eliminate use of analgesics  Outcome: Completed  Goal: Patients pain is managed to allow active participation in daily activities  Outcome: Completed  Goal: Patient verbalizes a reduction in pain level  Outcome: Completed  Goal: Discharge Pain Management Plan (Acute Pain)  Outcome: Completed     Problem: Hemodynamic Status  Goal: Patient's vitals signs are stable  Description: Assess and monitor patient's heart rate, rhythm, respiratory rate, peripheral pulses, capillary refill, color, body temperature, intake and output, labs and physical activity tolerance.   Observe for signs of chest pain (note location, duration, severity, radiation and associated symptoms such as diaphoresis, nausea, indigestion).  Monitor for signs and symptoms of heart failure (eg. shortness of breath, edema of feet/ankles/legs, rapid irregular heart rate, coughing, wheezing, white/pink blood tinged sputum, sudden weight gain, chest pain). Collaborate with interdisciplinary team and initiate plan and interventions as ordered.  Outcome: Completed     Problem: Safety  Goal: Free from accidental physical injury  Outcome: Completed

## 2019-07-27 NOTE — Nursing Note (Signed)
Dr Hettie Holstein at bedside, stated to D/C magnesium, fluids, and foley catheter

## 2019-07-27 NOTE — Unmapped (Signed)
[facility]    Obstetric Discharge Instructions        ACTIVITY:    ?? Avoid lifting anything over 10 pounds, driving, using the sweeper or leaving your home for an extended period of time. You may resume activities that you feel are appropriate                                                   ?? No exercise until you follow-up with your doctor                                             ?? You may go up and down stairs as necessary    ?? You should shower daily    ?? After your delivery, you may return to work/school/normal activity as directed by your physician on your follow-up visit    DIET:    ?? Maintain good dietary habits. Include the basic food groups: meats, dairy, cereals, fruits and vegetables    ?? If breastfeeding, increase your calories and fluids. See specific diet for breast feeding mothers located in your breastfeeding handouts    INCISION/WOUND CARE:    ?? Always use hand sanitizer or wash your hands before touching your incision    ?? Take a shower every day-avoid taking a tub bath, sitting in hot tubs or swimming in a pool for 6 weeks after surgery    ?? Wash your incision with soapy water and rinse with clean water at least once a day. You can let soapy water from the shower run over your incision. Gently pat the incision dry after showering    ?? If the pieces of tape on your incision do not fall off within 1 week, please remove them    ?? Keep your incision dry (except when washing it) and keep the folds of the skin around the incision dry. Expose the incision to as much air as possible    ?? Do not pick at the scabs-they help protect your incision    ?? If you use a binder, remove it 3-4 times a day to allow air to get to your incision    ?? If you are diabetic, you are at a higher risk for infection.-watch your incision closely and keep your blood sugars under control    ?? Take your medications exactly as they have been prescribed for you    ?? Avoid smoking and illegal drug use    ?? Call your  doctor if:  *Your incision becomes red, swollen, drains pus or is warm to the touch              PERINEAL CARE:    ?? You will be sent home with a peri-bottle (squeeze bottle) to cleanse the perineum (vaginal area) after each time you urinate of have a bowel movement    ?? Continue good feminine hygiene by wiping from front to back    ?? If you have stitches, you will receive Tucks and Demoplast spray. If using Demoplast, hold the can at least 12 inches away from your stitches to prevent burning. Your nurse will instruct you more in their use    ?? Change your sanitary pad frequently    ??  If hemorrhoids are present, you may apply Tucks    ?? Use of a warm sitz bath is encouraged to speed the healing process of the perineum (20 minutes twice a day)- this may be done using a disposable sitz bath or a tub type as long as the tub is thoroughly scrubbed with cleaning agents and rinsed prior to each use    LOCHIA/VAGINAL BLEEDING:    ?? Appearance can change from red to pink to white or from red to brown. This can last up to six weeks    ?? Call your doctor or midwife if the lochia (bloody discharge) is greenish or has a foul odor    ?? Call your doctor or midwife if the bleeding increases enough to cause soaking of a sanitary pad in an hour and/or if clots are passed that are larger than a small egg    BREASTFEEDING:    ?? Consult the breastfeeding handouts    ?? Wear a supportive bra with easy access to the breast    ?? Call your doctor or midwife immediately if you develop a hardened area that is red, sore and hot to the touch on your breast        [facility] Lactation Services:    During Office Hours - 8am-4pm    ?? Inpatient: [phone]    ?? Outpatient: [phone]    After Office Hours:    ?? [phone]      [facility] Lactation Services:    ??? Baby Caf?? at [facility] [phone]  ??? [facility] [place] [facility] [phone]  ??? After hours [place] Lactation Consultant line [phone]    Community Resources:  [place] [employer]: 978-243-7333 or http://www.herring.[email or web address]/  WIC: (800) 755-GROW 343-798-2303) or odh.ResidentialLock.ch  [place] WIC: (901) 288-3779 or nkyhealth.org  [place] Department of Health & Human Services:  ([phone] or [email or web address].[email or web address]/  Breastfeeding Medicine Clinic through [facility] Only): [phone]    BREAST CARE (non-breastfeeding):    ?? Wear a tight fitting supportive bra    ?? Avoid nipple stimulation if not breastfeeding    ?? Use ice packs to relieve discomfort if you become engorged (swollen and hard). Do not place ice packs directly on skin, wrap them in a towel first. Place then 20 minutes on and 20 minutes off breast to prevent rebound effect    ?? Breast pads are available at your drug store to prevent leaking through clothing    ?? Keep your back to warm water while showering; this prevents milk stimulation    SEX/BIRTH CONTROL:    ?? No sex, douching, or tampons for at least 6 weeks    ?? There are many methods of birth control available. Consult your doctor/midwife    SPECIAL INSTRUCTIONS: Please call your doctor IMMEDIATELY if you are having trouble with:    ?? Increased vaginal bleeding (more than one pad per hour)    ?? Foul smelling vaginal discharge    ?? Problems with bowel movements or urination    ?? Leg pains or increased swelling    ?? Shortness of breath    ?? Problems with breasts, especially redness, pain or engorgement    ?? Feeling depressed, tearful, unable to cope, having suicidal or homicidal thoughts    ?? Your temperature goes above 100.4    FOLLOW-UP:    ?? Call TODAY for an appointment or as soon as possible if an appointment hasn't already been made  for you    ?? If a home health visit is arranged by the discharge coordinators - call if you have questions at [phone]        I understand that if any problems occur once I am at at home I am to contact my physician.    I understand and acknowledge receipt of the instructions indicated above.    I have verified Mother and  Infant identification bands match at the time of discharge.              _____________________________________________________________    RN or Physician's signature Date/Time            _____________________________________________________________    Patient or Representative Signature Date/Time

## 2019-07-27 NOTE — Progress Notes (Signed)
[facility] Post-Partum Rounding Note       Post-partum Day #1 s/p CS     Subjective:   Patient doing well. No complaints. Pain controlled. Tolerating Regular diet. Ambulating and voiding with no difficulty. No chest pains, no SOB. Flatus No. Bowel Movement No  Lochia: mild  Feeding: Baby in [facility] on CPAP, plans to breast feed     Objective:  Vitals:    07/27/19 0433 07/27/19 0533 07/27/19 0622 07/27/19 0633   BP: 122/68 121/63  132/69   Pulse: 101 92  87   Resp: 18 18  18    Temp:   98 F (36.7 C)    TempSrc:   Oral    SpO2:       Weight:       Height:            Physical Examination:  Appears well  Uterus: Firm, NT  Incision: Dressing in place, no drainage  Calves: NT, No edema, No signs of DVT     Lab Results   Component Value Date    WBC 15.1 (H) 07/27/2019    RBC 3.55 (L) 07/27/2019    HGB 10.8 (L) 07/27/2019    HCT 30.8 (L) 07/27/2019    MCV 86.7 07/27/2019    MCH 30.4 07/27/2019    MCHC 35.1 07/27/2019    RDW 13.7 07/27/2019    PLT 184 07/27/2019       Assessment: 29 y.o. [id number] s/p CS  Body mass index is 40.35 kg/m.    Plan:  1. Preeclampsia - good urine output with over 300 ml/hr, BP stable. Will stop magnesium so patient can go to SCN. Discussed the possibility of increased blood pressures in the next few days and antihypertensives if they increased.  2. Advance care today  3. Baby in [facility] for CPAP due to prematurity  4. Aniticipate D/C in 2-3 days  5. COVID 1/22, recovered  6. Hg stable    Tyshan Enderle  07/27/19

## 2019-07-27 NOTE — Unmapped (Signed)
Pt given prescriptions, reviewed discharge instructions, gift bag given, reiterated to return to office for BP recheck Monday.  All questions answered. Patient states she is relieved to be able to follow infant, and other child at home is being cared for by in-laws. Pt discharged per wheelchair to private auto with husband accompanying with belongings.

## 2020-03-14 LAB — OBSTETRIC PANEL
Basophils %: 0.3 %
Basophils Absolute: 0 10*3/uL (ref 0.0–0.2)
Eosinophils %: 1.3 %
Eosinophils Absolute: 0.1 10*3/uL (ref 0.0–0.6)
Hematocrit: 40.7 % (ref 36.0–48.0)
Hemoglobin: 14 g/dL (ref 12.0–16.0)
Hep B S Ag Interp: NONREACTIVE
Lymphocytes %: 31 %
Lymphocytes Absolute: 2.4 10*3/uL (ref 1.0–5.1)
MCH: 29.2 pg (ref 26.0–34.0)
MCHC: 34.5 g/dL (ref 31.0–36.0)
MCV: 84.7 fL (ref 80.0–100.0)
MPV: 7.9 fL (ref 5.0–10.5)
Monocytes %: 8.6 %
Monocytes Absolute: 0.7 10*3/uL (ref 0.0–1.3)
Neutrophils %: 58.8 %
Neutrophils Absolute: 4.6 10*3/uL (ref 1.7–7.7)
Platelets: 275 10*3/uL (ref 135–450)
RBC: 4.81 M/uL (ref 4.00–5.20)
RDW: 13 % (ref 12.4–15.4)
Rubella Antibody IgG: 179 IU/mL
Total Syphillis IgG/IgM: NONREACTIVE
WBC: 7.7 10*3/uL (ref 4.0–11.0)

## 2020-03-14 LAB — HIV SCREEN
HIV ANTIGEN: NONREACTIVE
HIV Ag/Ab: NONREACTIVE
HIV-1 Antibody: NONREACTIVE
HIV-2 Ab: NONREACTIVE

## 2020-03-14 LAB — TYPE AND SCREEN
ABO/Rh: A POS
Antibody Screen: NEGATIVE

## 2020-03-14 LAB — HEPATITIS C ANTIBODY: Hep C Ab Interp: NONREACTIVE

## 2020-03-15 LAB — CULTURE, URINE: Urine Culture, Routine: NO GROWTH

## 2022-06-05 NOTE — Unmapped (Addendum)
Pt for birth control discussion. Maybe interested in Paragard IUD. Doesn't want  many hormones. Currently natural family planning, was thinking about tubal but  not 100% she's finished having kids.

## 2022-06-05 NOTE — Unmapped (Addendum)
31yo her for contraception options  We discussed all options, SE and risks.  Considering LARCs  No other complaints  Does not want depo, patch, ring, or OCPs  Thinking about IUD  Discussed insertion, differences in IUDs, SE profiles and bleeding profiles  Past Medical History:  Diagnosis Date   Fracture Age 28   Arm   Preeclampsia   with second preg      ICD-10-CM ICD-9-CM  1. Encounter for other contraceptive management  Z30.8 V25.8      Rtc prn, call with choice
